# Patient Record
Sex: Female | Born: 1985
Health system: Southern US, Community
[De-identification: ages and names within clinical notes are randomized; demographics above are authoritative.]

## PROBLEM LIST (undated history)

## (undated) DIAGNOSIS — F32A Depression, unspecified: Secondary | ICD-10-CM

## (undated) DIAGNOSIS — F50019 Anorexia nervosa, restricting type, unspecified: Secondary | ICD-10-CM

## (undated) DIAGNOSIS — F419 Anxiety disorder, unspecified: Secondary | ICD-10-CM

## (undated) DIAGNOSIS — E7800 Pure hypercholesterolemia, unspecified: Secondary | ICD-10-CM

## (undated) DIAGNOSIS — F502 Bulimia nervosa, unspecified: Secondary | ICD-10-CM

## (undated) DIAGNOSIS — F5001 Anorexia nervosa, restricting type: Secondary | ICD-10-CM

## (undated) DIAGNOSIS — F329 Major depressive disorder, single episode, unspecified: Secondary | ICD-10-CM

## (undated) HISTORY — DX: Anorexia nervosa, restricting type, unspecified: F50.019

## (undated) HISTORY — DX: Major depressive disorder, single episode, unspecified: F32.9

## (undated) HISTORY — DX: Depression, unspecified: F32.A

## (undated) HISTORY — DX: Bulimia nervosa, unspecified: F50.20

## (undated) HISTORY — PX: NO PAST SURGERIES: SHX2092

## (undated) HISTORY — DX: Pure hypercholesterolemia, unspecified: E78.00

## (undated) HISTORY — DX: Anorexia nervosa, restricting type: F50.01

## (undated) HISTORY — DX: Anxiety disorder, unspecified: F41.9

## (undated) HISTORY — DX: Bulimia nervosa: F50.2

---
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
## 2015-10-14 ENCOUNTER — Encounter: Payer: Self-pay | Admitting: Obstetrics and Gynecology

## 2015-11-14 ENCOUNTER — Encounter: Payer: Self-pay | Admitting: Family Medicine

## 2015-11-14 ENCOUNTER — Ambulatory Visit (INDEPENDENT_AMBULATORY_CARE_PROVIDER_SITE_OTHER): Payer: 59 | Admitting: Family Medicine

## 2015-11-14 VITALS — BP 115/71 | HR 65 | Temp 98.3°F | Resp 18 | Ht 64.0 in | Wt 136.5 lb

## 2015-11-14 DIAGNOSIS — Z01419 Encounter for gynecological examination (general) (routine) without abnormal findings: Secondary | ICD-10-CM

## 2015-11-14 DIAGNOSIS — Z113 Encounter for screening for infections with a predominantly sexual mode of transmission: Secondary | ICD-10-CM

## 2015-11-14 MED ORDER — NORETHIN-ETH ESTRAD-FE BIPHAS 1 MG-10 MCG / 10 MCG PO TABS: 1.0000 | ORAL_TABLET | Freq: Every day | ORAL | Status: DC

## 2015-11-14 NOTE — Progress Notes (Signed)
  Subjective:     Brooke PoagLauren Smail is a 30 y.o. female here for a routine exam.  Current complaints: none.  Not currently sexually active, but would like to start OCPs.  Has been on yas in the past, but had some side effects.   Gynecologic History Patient's last menstrual period was 10/23/2015. Contraception: none Last Pap: 2014. Results were: normal Last mammogram: n/a.   Obstetric History OB History  No data available     The following portions of the patient's history were reviewed and updated as appropriate: allergies, current medications, past family history, past medical history, past social history, past surgical history and problem list.  Review of Systems Pertinent items noted in HPI and remainder of comprehensive ROS otherwise negative.    Objective:      General Appearance:    Alert, cooperative, no distress, appears stated age  Head:    Normocephalic, without obvious abnormality, atraumatic  Eyes:    PERRL, conjunctiva/corneas clear, EOM's intact, fundi    benign, both eyes  Throat:   Lips, mucosa, and tongue normal; teeth and gums normal  Neck:   Supple, symmetrical, trachea midline, no adenopathy;    thyroid:  no enlargement/tenderness/nodules; no carotid   bruit or JVD  Back:     Symmetric, no curvature, ROM normal, no CVA tenderness  Lungs:     Clear to auscultation bilaterally, respirations unlabored  Chest Wall:    No tenderness or deformity   Heart:    Regular rate and rhythm, S1 and S2 normal, no murmur, rub   or gallop  Breast Exam:    No tenderness, masses, or nipple abnormality  Abdomen:     Soft, non-tender, bowel sounds active all four quadrants,    no masses, no organomegaly  Genitalia:    Normal female without lesion, discharge or tenderness.         Vaginal rugae normal.  Cervix normal in appearance.  Uterus   normal in size.  Adnexa normal, no masses or fullness   palpated.  Extremities:   Extremities normal, atraumatic, no cyanosis or edema  Pulses:    2+ and symmetric all extremities  Skin:   Skin color, texture, turgor normal, no rashes or lesions  Lymph nodes:   Cervical, supraclavicular, and axillary nodes normal  Neurologic:   CNII-XII intact, normal strength, sensation and reflexes    throughout       Assessment:    Healthy female exam.    Plan:    Education reviewed: low fat, low cholesterol diet, self breast exams and weight bearing exercise. Contraception: OCP (estrogen/progesterone). Follow up in: 1 year.  Start Lo loestrin Check cholesterol (patient has personal history of elevated cholesterol Check CMP

## 2015-11-14 NOTE — Addendum Note (Signed)
Addended by: Gerome ApleyZEYFANG, Queena Monrreal L on: 11/14/2015 04:55 PM   Modules accepted: Orders

## 2015-11-15 LAB — COMPREHENSIVE METABOLIC PANEL
ALT: 13 U/L (ref 6–29)
AST: 19 U/L (ref 10–30)
Albumin: 4.6 g/dL (ref 3.6–5.1)
Alkaline Phosphatase: 76 U/L (ref 33–115)
BUN: 8 mg/dL (ref 7–25)
CO2: 25 mmol/L (ref 20–31)
Calcium: 9.5 mg/dL (ref 8.6–10.2)
Chloride: 104 mmol/L (ref 98–110)
Creat: 0.67 mg/dL (ref 0.50–1.10)
Glucose, Bld: 82 mg/dL (ref 65–99)
Potassium: 4.3 mmol/L (ref 3.5–5.3)
Sodium: 140 mmol/L (ref 135–146)
Total Bilirubin: 0.4 mg/dL (ref 0.2–1.2)
Total Protein: 7.3 g/dL (ref 6.1–8.1)

## 2015-11-15 LAB — LIPID PANEL
Cholesterol: 158 mg/dL (ref 125–200)
HDL: 74 mg/dL (ref >=46)
LDL Cholesterol: 65 mg/dL (ref <130)
Total CHOL/HDL Ratio: 2.1 Ratio (ref <=5.0)
Triglycerides: 96 mg/dL (ref <150)
VLDL: 19 mg/dL (ref <30)

## 2015-11-16 ENCOUNTER — Encounter: Payer: Self-pay | Admitting: Family Medicine

## 2015-11-16 LAB — CYTOLOGY - PAP

## 2015-12-21 ENCOUNTER — Ambulatory Visit (HOSPITAL_COMMUNITY): Payer: 59 | Admitting: Psychiatry

## 2015-12-27 ENCOUNTER — Ambulatory Visit (INDEPENDENT_AMBULATORY_CARE_PROVIDER_SITE_OTHER): Payer: 59 | Admitting: Licensed Clinical Social Worker

## 2015-12-27 ENCOUNTER — Encounter (HOSPITAL_COMMUNITY): Payer: Self-pay | Admitting: Licensed Clinical Social Worker

## 2015-12-27 DIAGNOSIS — F331 Major depressive disorder, recurrent, moderate: Secondary | ICD-10-CM | POA: Diagnosis not present

## 2015-12-27 DIAGNOSIS — F411 Generalized anxiety disorder: Secondary | ICD-10-CM

## 2015-12-27 NOTE — Progress Notes (Signed)
Comprehensive Clinical Assessment (CCA) Note  12/27/2015 Brooke Fernandez [id number]  Visit Diagnosis:      ICD-9-CM ICD-10-CM   1. Moderate episode of recurrent major depressive disorder (HCC) 296.32 F33.1   2. Generalized anxiety disorder 300.02 F41.1       CCA Part One  Part One has been completed on paper by the patient.  (See scanned document in Chart Review)  CCA Part Two A  Intake/Chief Complaint:  CCA Intake With Chief Complaint CCA Part Two Date: 12/27/15 CCA Part Two Time: 1616 Chief Complaint/Presenting Problem: anxiety and depression and eating disorder Patients Currently Reported Symptoms/Problems: stress, anxiety, depression mostly surrounding work Collateral Involvement: pt Individual's Strengths: educated, supportive parents, desire to be healthy Individual's Preferences: prefers outpatient therapy Individual's Abilities: ability to work through issues of anxiety and depression Type of Services Patient Feels Are Needed: outpatient therapy  Mental Health Symptoms Depression:  Depression: Change in energy/activity, Hopelessness, Increase/decrease in appetite, Irritability, Sleep (too much or little), Tearfulness, Worthlessness (isolation)  Mania:     Anxiety:   Anxiety: Restlessness, Tension, Worrying  Psychosis:     Trauma:     Obsessions:     Compulsions:     Inattention:     Hyperactivity/Impulsivity:     Oppositional/Defiant Behaviors:     Borderline Personality:  Emotional Irregularity: Chronic feelings of emptiness (negative self-image)  Other Mood/Personality Symptoms:      Mental Status Exam Appearance and self-care  Stature:  Stature: Small  Weight:  Weight: Average weight  Clothing:  Clothing: Casual  Grooming:  Grooming: Normal  Cosmetic use:  Cosmetic Use: Age appropriate  Posture/gait:  Posture/Gait: Normal  Motor activity:  Motor Activity: Not Remarkable  Sensorium  Attention:  Attention: Normal  Concentration:  Concentration: Normal   Orientation:  Orientation: X5  Recall/memory:  Recall/Memory: Normal  Affect and Mood  Affect:  Affect: Appropriate  Mood:  Mood: Euthymic  Relating  Eye contact:  Eye Contact: Normal  Facial expression:  Facial Expression: Responsive  Attitude toward examiner:  Attitude Toward Examiner: Cooperative  Thought and Language  Speech flow: Speech Flow: Normal  Thought content:  Thought Content: Appropriate to mood and circumstances  Preoccupation:     Hallucinations:     Organization:     Company secretary of Knowledge:  Fund of Knowledge: Average  Intelligence:  Intelligence: Above Average  Abstraction:  Abstraction: Normal  Judgement:  Judgement: Normal  Reality Testing:  Reality Testing: Realistic  Insight:  Insight: Good  Decision Making:  Decision Making: Normal  Social Functioning  Social Maturity:  Social Maturity: Isolates  Social Judgement:  Social Judgement: Normal  Stress  Stressors:  Stressors: Work, Transitions  Coping Ability:  Coping Ability: Deficient supports  Skill Deficits:     Supports:      Family and Psychosocial History: Family history Marital status: Single  Childhood History:  Childhood History By whom was/is the patient raised?: Mother, Father, Psychologist, occupational and step-parent Additional childhood history information:  parents divorced at age 89, doesn't remember, always had 2 families Description of patient's relationship with caregiver when they were a child: closest to biological mother, estranged from biological father Patient's description of current relationship with people who raised him/her: better relationship with father, overall healthy relationships with all 4 parents How were you disciplined when you got in trouble as a child/adolescent?: catholic guilt, I was a good kid Does patient have siblings?: Yes Number of Siblings: 2 Description of patient's current relationship with siblings: stepbrother not a  close  relationship, half  brother very close relationship Did patient suffer any verbal/emotional/physical/sexual abuse as a child?: No Did patient suffer from severe childhood neglect?: Yes (both parents are narcisistic) Has patient ever been sexually abused/assaulted/raped as an adolescent or adult?: No Was the patient ever a victim of a crime or a disaster?: No Witnessed domestic violence?: No Has patient been effected by domestic violence as an adult?: No  CCA Part Two B  Employment/Work Situation: Employment / Work Psychologist, occupationalituation Employment situation: Employed Where is patient currently employed?: [employer], 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 employed?: 6mos Has patient ever been in the Eli Lilly and Companymilitary?: No Has patient ever served in combat?: No Did You Receive Any Psychiatric Treatment/Services While in Equities traderthe Military?: No Are There Guns or Other Weapons in Your Home?: No Are These ComptrollerWeapons Safely Secured?: No  Education: Education Last Grade Completed: 16 Did Garment/textile technologistYou Graduate From [employer] School?: Yes Did Theme park managerYou Attend College?: Yes What Was Your Major?: nursing Did You Have An Individualized Education Program (IIEP): No Did You Have Any Difficulty At Progress EnergySchool?: No  Religion: Religion/Spirituality Are You A Religious Person?: No  Leisure/Recreation: Leisure / Recreation Leisure and Hobbies: take dogs for walks   Exercise/Diet: Exercise/Diet Do You Exercise?: Yes What Type of Exercise Do You Do?: Run/Walk (yoga) How Many Times a Week Do You Exercise?: 1-3 times a week Do You Follow a Special Diet?: Yes Type of Diet: flexible vegan Do You Have Any Trouble Sleeping?: No  CCA Part Two C  Alcohol/Drug Use: Alcohol / Drug Use Pain Medications: none Prescriptions: none Over the Counter: none History of alcohol / drug use?: Yes Substance #1 Name of Substance 1: alcohol - binge drinks on the weekends (Friday and Saturday nights) 1 - Age of First Use: 19 1 - Amount (size/oz): 7 drinks ( beer or liquoir) 1 -  Frequency: 2 days per week 1 - Last Use / Amount: 12/24/15 saturday - 3 large margaritas                    CCA Part Three  ASAM's:  Six Dimensions of Multidimensional Assessment  Dimension 1:  Acute Intoxication and/or Withdrawal Potential:     Dimension 2:  Biomedical Conditions and Complications:     Dimension 3:  Emotional, Behavioral, or Cognitive Conditions and Complications:     Dimension 4:  Readiness to Change:     Dimension 5:  Relapse, Continued use, or Continued Problem Potential:     Dimension 6:  Recovery/Living Environment:      Substance use Disorder (SUD)    Social Function:  Social Functioning Social Maturity: Isolates Social Judgement: Normal  Stress:  Stress Stressors: Work, Transitions Coping Ability: Deficient supports Patient Takes Medications The Patent attorneyWay The Doctor Instructed?: NA Priority Risk: Low Acuity  Risk Assessment- Self-Harm Potential: Risk Assessment For Self-Harm Potential Thoughts of Self-Harm: No current thoughts Method: No plan Availability of Means: No access/NA  Risk Assessment -Dangerous to Others Potential: Risk Assessment For Dangerous to Others Potential Method: No Plan Availability of Means: No access or NA Intent: Vague intent or NA Notification Required: No need or identified person  DSM5 Diagnoses: There are no active problems to display for this patient.   Patient Centered Plan: Patient is on the following Treatment Plan(s):  Anxiety and Depression  Recommendations for Services/Supports/Treatments: Recommendations for Services/Supports/Treatments Recommendations For Services/Supports/Treatments: Individual Therapy, Medication Management  Treatment Plan Summary:  Diminish symptoms of depression and anxiety  Referrals to Alternative Service(s): Referred to  Alternative Service(s):   Place:   Date:   Time:    Referred to Alternative Service(s):   Place:   Date:   Time:    Referred to Alternative Service(s):    Place:   Date:   Time:    Referred to Alternative Service(s):   Place:   Date:   Time:     Vernona Rieger

## 2016-01-02 ENCOUNTER — Ambulatory Visit (INDEPENDENT_AMBULATORY_CARE_PROVIDER_SITE_OTHER): Payer: 59 | Admitting: Licensed Clinical Social Worker

## 2016-01-02 DIAGNOSIS — F411 Generalized anxiety disorder: Secondary | ICD-10-CM | POA: Diagnosis not present

## 2016-01-02 DIAGNOSIS — F331 Major depressive disorder, recurrent, moderate: Secondary | ICD-10-CM

## 2016-01-04 ENCOUNTER — Encounter (HOSPITAL_COMMUNITY): Payer: Self-pay | Admitting: Licensed Clinical Social Worker

## 2016-01-04 ENCOUNTER — Ambulatory Visit (INDEPENDENT_AMBULATORY_CARE_PROVIDER_SITE_OTHER): Payer: 59 | Admitting: Licensed Clinical Social Worker

## 2016-01-04 DIAGNOSIS — F331 Major depressive disorder, recurrent, moderate: Secondary | ICD-10-CM | POA: Diagnosis not present

## 2016-01-04 DIAGNOSIS — F411 Generalized anxiety disorder: Secondary | ICD-10-CM | POA: Diagnosis not present

## 2016-01-04 NOTE — Progress Notes (Signed)
Daily Group Progress Note  Program: Oupatient    Group Time: 5:15-6:30  Participation Level: Active  Behavioral Response: Appropriate  Type of Therapy:  Psychoeducation/Group Therapy  Summary of Progress:  Today was the introductory session for the outpatient program. Pt participated in a discussion about what she needs from the group process in order to feel safe enough to participate. Pt was able to share what brought her to treatment and what she hopes to gain in an outpatient group.  Lisbeth S. Mackenzie, LCAS-A 

## 2016-01-05 ENCOUNTER — Encounter (HOSPITAL_COMMUNITY): Payer: Self-pay | Admitting: Licensed Clinical Social Worker

## 2016-01-05 NOTE — Progress Notes (Signed)
Daily Group Progress Note  Program: Outpatient     Group Time: 5:15-6:30  Participation Level: Active  Behavioral Response: Appropriate  Type of Therapy:  Psychoeducation/Group Therapy  Summary of Progress:   Pt participated in a discussion about self-care. Pt struggles with putting herself first. She has always taken care of everyone else, but not herself. Pt was encouraged to make a list of her personal self-care and steps she  must take to put herself first.    Lisbeth S. Mackenzie, LCAS-A   

## 2016-01-09 ENCOUNTER — Ambulatory Visit (INDEPENDENT_AMBULATORY_CARE_PROVIDER_SITE_OTHER): Payer: 59 | Admitting: Licensed Clinical Social Worker

## 2016-01-09 DIAGNOSIS — F331 Major depressive disorder, recurrent, moderate: Secondary | ICD-10-CM | POA: Diagnosis not present

## 2016-01-09 DIAGNOSIS — F411 Generalized anxiety disorder: Secondary | ICD-10-CM

## 2016-01-10 ENCOUNTER — Encounter (HOSPITAL_COMMUNITY): Payer: Self-pay | Admitting: Licensed Clinical Social Worker

## 2016-01-10 ENCOUNTER — Ambulatory Visit (HOSPITAL_COMMUNITY): Payer: Self-pay | Admitting: Licensed Clinical Social Worker

## 2016-01-10 NOTE — Progress Notes (Signed)
Daily Group Progress Note  Program: Outpatient   Group Time: 5:15-6:30pm  Participation Level: Active  Behavioral Response: Appropriate  Type of Therapy:  Psychoeducation/Group process  Summary of Progress: Pt participated in a discussion on the use of boundaries with family members and relationships. Pt was encouraged to remain in the present, and use non-permeable boundaries for protection against words.   Vernona RiegerLisbeth S Mackenzie, LCAS-A

## 2016-01-11 ENCOUNTER — Ambulatory Visit (INDEPENDENT_AMBULATORY_CARE_PROVIDER_SITE_OTHER): Payer: 59 | Admitting: Licensed Clinical Social Worker

## 2016-01-11 DIAGNOSIS — F411 Generalized anxiety disorder: Secondary | ICD-10-CM

## 2016-01-12 NOTE — Progress Notes (Signed)
Daily Group Progress Note  Program: Outpatient   Group Time: 5:15-6:30pm  Participation Level: Active  Behavioral Response: Appropriate  Type of Therapy:  Psychoeducation/Group process  Summary of Progress: Patient presented on time and was engaged in discussion. Patient shared struggles with her relationships and her anxieties which lead to catastrophic thinking. Patient interacted appropriately in the group and accepted feedback. Patient was encouraged to use the skill, checking the facts, to de-escalate unfounded worries. Patient denies SI/HI or any current immediate needs.    Brooke GuilesJenny Juma Oxley, MSW, LCSW, [address] BoulevardCASA

## 2016-01-16 ENCOUNTER — Ambulatory Visit (INDEPENDENT_AMBULATORY_CARE_PROVIDER_SITE_OTHER): Payer: 59 | Admitting: Psychiatry

## 2016-01-16 ENCOUNTER — Ambulatory Visit (INDEPENDENT_AMBULATORY_CARE_PROVIDER_SITE_OTHER): Payer: 59 | Admitting: Licensed Clinical Social Worker

## 2016-01-16 ENCOUNTER — Encounter: Payer: Self-pay | Admitting: Psychiatry

## 2016-01-16 VITALS — BP 112/73 | HR 63 | Temp 98.2°F | Ht 64.0 in | Wt 132.2 lb

## 2016-01-16 DIAGNOSIS — F330 Major depressive disorder, recurrent, mild: Secondary | ICD-10-CM

## 2016-01-16 DIAGNOSIS — F411 Generalized anxiety disorder: Secondary | ICD-10-CM | POA: Diagnosis not present

## 2016-01-16 DIAGNOSIS — F509 Eating disorder, unspecified: Secondary | ICD-10-CM

## 2016-01-16 DIAGNOSIS — F331 Major depressive disorder, recurrent, moderate: Secondary | ICD-10-CM

## 2016-01-16 DIAGNOSIS — Z7289 Other problems related to lifestyle: Secondary | ICD-10-CM

## 2016-01-16 DIAGNOSIS — Z789 Other specified health status: Secondary | ICD-10-CM | POA: Diagnosis not present

## 2016-01-16 MED ORDER — BUSPIRONE HCL 10 MG PO TABS: 10.0000 mg | ORAL_TABLET | Freq: Two times a day (BID) | ORAL | 0 refills | Status: DC

## 2016-01-16 NOTE — Progress Notes (Signed)
Psychiatric Initial Adult Assessment   Patient Identification: Brooke Fernandez MRN:  [id number] Date of Evaluation:  01/16/2016 Referral Source: [facility] Chief Complaint:   Chief Complaint    Establish Care; Anxiety; Depression     Visit Diagnosis:    ICD-9-CM ICD-10-CM   1. MDD (major depressive disorder), recurrent episode, mild (HCC) 296.31 F33.0   2. Eating disorder 307.50 F50.9   3. Alcohol use (HCC) V49.89 Z78.9     History of Present Illness:    Patient is a 30 year old female who presented from [place] office. She is currently attending group therapy over there. Patient reported that she has history of anxiety and depression and alcohol use. Patient has recently relocated from [facility] 6 months ago and has started working in the hospital. She reported that she has history of binge drinking and consumes alcohol over the weekend. She reported that she will not drink excessively over the weekend to the point that she will pass out. 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 She reported that she has tried all the SSRIs while she was in [place]. She does not want to take any medication that'll cause weight gain. She reported that she feels anxious throughout the day and is looking for something else. She reported that she does not want to gain weight as she has history of eating disorder and will run 15 miles on a weekly basis. She was usually do in 2-3 days. She reported that she is not binge eating at this time.  She denied using other illicit drugs including cocaine and marijuana at this time. She reported that she lives by herself as her family is still in [place]. She is trying to find new friends.  Associated Signs/Symptoms: Depression Symptoms:  depressed mood, fatigue, feelings of worthlessness/guilt, difficulty  concentrating, anxiety, (Hypo) Manic Symptoms:  Labiality of Mood, Anxiety Symptoms:  Excessive Worry, Psychotic Symptoms:  none reported PTSD Symptoms: Negative NA  Past Psychiatric History:   Patient reported history of depression and anxiety and eating disorder. She reported that she has seen a psychiatrist in the past. She denied any history of previous psychiatric  hospitalization. She denied any history of suicide attempts. She is currently attending group therapy in [place].  Previous Psychotropic Medications:   Saw a Therapist, sports in [place], [facility] All SSRI Wellbutrin- helped- last Nov Klonopin  Substance Abuse History in the last 12 months:  Yes.    Drinks alcohol on a on the weekends. Patient reported that she drinks excessively at that time. She has history of blackouts in the weekend. Patient currently denied any pending legal charges.  Consequences of Substance Abuse: Negative NA  Past Medical History:  Past Medical History:  Diagnosis Date  . Anxiety   . Depression    History reviewed. No pertinent surgical history.  Family Psychiatric History: Brother- anxiety, takes xanax Brother- alcohol - addictive personality Mother- depression- takes Prozac    Family History:  Family History  Problem Relation Age of Onset  . CAD Maternal Grandmother   . Colon cancer Maternal Grandfather   . Breast cancer Maternal Aunt   . Depression Mother   . Anxiety disorder Brother   . Depression Brother     Social History:   Social History   Social History  . Marital status: Single    Spouse name: N/A  .  Number of children: N/A  . Years of education: N/A   Social History Main Topics  . Smoking status: Never Smoker  . Smokeless tobacco: Never Used  . Alcohol use 3.0 - 6.0 oz/week    5 Shots of liquor per week  . Drug use: No  . Sexual activity: Yes    Birth control/ protection: Pill   Other Topics Concern  . None   Social  History Narrative  . None    Additional Social History:  Born in [place]. Was in [place] past 7 years.  Came to [place], [place] 6  months ago.    Allergies:  No Known Allergies  Metabolic Disorder Labs: No results found for: HGBA1C, MPG No results found for: PROLACTIN Lab Results  Component Value Date   CHOL 158 11/14/2015   TRIG 96 11/14/2015   HDL 74 11/14/2015   CHOLHDL 2.1 11/14/2015   VLDL 19 11/14/2015   LDLCALC 65 11/14/2015     Current Medications: Current Outpatient Prescriptions  Medication Sig Dispense Refill  . b complex vitamins tablet Take 1 tablet by mouth daily.    . Biotin (BIOTIN MAXIMUM STRENGTH) 10 MG TABS Take by mouth.    . Norethindrone-Ethinyl Estradiol-Fe Biphas (LO LOESTRIN FE) 1 MG-10 MCG / 10 MCG tablet Take 1 tablet by mouth daily. 1 Package 11  . Specialty Vitamins Products (ADVANCED COLLAGEN) TABS Take 600 mg by mouth.    . busPIRone (BUSPAR) 10 MG tablet Take 1 tablet (10 mg total) by mouth 2 (two) times daily. 60 tablet 0   No current facility-administered medications for this visit.     Neurologic: Headache: No Seizure: No Paresthesias:No  Musculoskeletal: Strength & Muscle Tone: within normal limits Gait & Station: normal Patient leans: N/A  Psychiatric Specialty Exam: ROS  Blood pressure 112/73, pulse 63, temperature 98.2 F (36.8 C), temperature source Oral, height 5\' 4"  (1.626 m), weight 132 lb 3.2 oz (60 kg), last menstrual period 11/17/2015.Body mass index is 22.69 kg/m.  General Appearance: Casual  Eye Contact:  Fair  Speech:  Clear and Coherent  Volume:  Normal  Mood:  Anxious  Affect:  Congruent  Thought Process:  Goal Directed  Orientation:  Full (Time, Place, and Person)  Thought Content:  WDL  Suicidal Thoughts:  No  Homicidal Thoughts:  No  Memory:  Immediate;   Fair Recent;   Fair Remote;   Fair  Judgement:  Fair  Insight:  Fair  Psychomotor Activity:  Normal  Concentration:  Concentration: Fair and  Attention Span: Fair  Recall:  [employer] of Knowledge:Fair  Language: Fair  Akathisia:  No  Handed:  Right  AIMS (if indicated):    Assets:  Communication Skills Desire for Improvement Physical Health Social Support  ADL's:  Intact  Cognition: WNL  Sleep:  8-10     Treatment Plan Summary: Medication management   Discussed with patient at length about the medications treatment risk benefits and alternatives.  Will be started on BuSpar 10 mg by mouth twice a day for her anxiety symptoms. She reported that she does not want to take any medications to cause weight gain.  I also discussed with her about the option of starting Trintillex  but she declined at this time.  She was started with a trial of BuSpar 10mg  BID to help with her anxiety at this time. She will follow-up with a psychiatrist in [facility].   She will also continue with her therapy at this time.  More than 50% of the time spent in psychoeducation, counseling and coordination of care.    This note was generated in part or whole with voice recognition software. Voice regonition is usually quite accurate but there are transcription errors that can and very often do occur. I apologize for any typographical errors that were not detected and corrected.    Brandy HaleUzma Jamara Vary, MD 8/28/20173:35 PM

## 2016-01-17 ENCOUNTER — Encounter (HOSPITAL_COMMUNITY): Payer: Self-pay | Admitting: Licensed Clinical Social Worker

## 2016-01-17 NOTE — Progress Notes (Signed)
Daily Group Progress Note  Program: Outpatient  Group Time: 5:15-6:30  Participation Level: Active  Behavioral Response: Appropriate  Type of Therapy:  Psychoeducation/Group Therapy  Summary of Progress:   Pt participated in a discussion: "being alone and not being lonely." During the discussion pt pointed out that she needs to work on her self-esteem so that she enjoys her own company. It was suggested to pt that she must first like herself before she will be able to bring someone else into her life. Pt was open during the discussion and suggestions.     Lisbeth S. Mackenzie, LCAS-A 

## 2016-01-18 ENCOUNTER — Ambulatory Visit (HOSPITAL_COMMUNITY): Payer: 59 | Admitting: Licensed Clinical Social Worker

## 2016-01-18 DIAGNOSIS — F411 Generalized anxiety disorder: Secondary | ICD-10-CM

## 2016-01-18 DIAGNOSIS — F331 Major depressive disorder, recurrent, moderate: Secondary | ICD-10-CM

## 2016-01-19 ENCOUNTER — Telehealth (HOSPITAL_COMMUNITY): Payer: Self-pay | Admitting: Licensed Clinical Social Worker

## 2016-01-19 ENCOUNTER — Encounter (HOSPITAL_COMMUNITY): Payer: Self-pay | Admitting: Licensed Clinical Social Worker

## 2016-01-19 NOTE — Progress Notes (Signed)
  Daily Group Progress Note  Program: Outpatient  Group Time: 5:15-6:30  Participation Level: Active  Behavioral Response: Appropriate  Type of Therapy:  Psychoeducation/Group Therapy  Summary of Progress:   Pt participated in a discussion: "Being strong, when you don't feel like being strong." Pt appeared open in the discussion about expectations from others. It was suggested to pt to use her self-awareness and self-care to be herself. Pt was receptive to the intervention.   Zitlali Primm S. Oriel Ojo, LCAS-A   

## 2016-01-23 ENCOUNTER — Ambulatory Visit (HOSPITAL_COMMUNITY): Payer: Self-pay | Admitting: Licensed Clinical Social Worker

## 2016-01-25 ENCOUNTER — Ambulatory Visit (INDEPENDENT_AMBULATORY_CARE_PROVIDER_SITE_OTHER): Payer: 59 | Admitting: Licensed Clinical Social Worker

## 2016-01-25 DIAGNOSIS — F331 Major depressive disorder, recurrent, moderate: Secondary | ICD-10-CM

## 2016-01-26 NOTE — Progress Notes (Signed)
Daily Group Progress Note  Program: Outpatient   Group Time: 5:15-6:45pm  Participation Level: Active  Behavioral Response: Appropriate  Type of Therapy:  Psychoeducation/Group process  Summary of Progress: Patient presented on time and was engaged in discussion. Patient shared continued struggles with her job and difficulty managing negative emotions related to her job. Patient interacted appropriately in the group and accepted feedback. Patient was encouraged to remember HALT and utilize self care to manage what she can.  Patient denies SI/HI or any current immediate needs.    Donia GuilesJenny Peyton Spengler, MSW, LCSW, [address]

## 2016-01-30 ENCOUNTER — Ambulatory Visit (INDEPENDENT_AMBULATORY_CARE_PROVIDER_SITE_OTHER): Payer: 59 | Admitting: Licensed Clinical Social Worker

## 2016-01-30 DIAGNOSIS — F411 Generalized anxiety disorder: Secondary | ICD-10-CM | POA: Diagnosis not present

## 2016-02-01 ENCOUNTER — Ambulatory Visit (INDEPENDENT_AMBULATORY_CARE_PROVIDER_SITE_OTHER): Payer: 59 | Admitting: Licensed Clinical Social Worker

## 2016-02-01 ENCOUNTER — Telehealth: Payer: Self-pay

## 2016-02-01 ENCOUNTER — Encounter (HOSPITAL_COMMUNITY): Payer: Self-pay | Admitting: Licensed Clinical Social Worker

## 2016-02-01 DIAGNOSIS — F411 Generalized anxiety disorder: Secondary | ICD-10-CM

## 2016-02-01 NOTE — Progress Notes (Signed)
Daily Group Progress Note  Program: Outpatient   Group Time: 5:15-6:6:45pm  Participation Level: Active  Behavioral Response: Appropriate  Type of Therapy:  Psychoeducation/Group Therapy  Summary of Progress:   Pt participated in a discussion on vulnerability and trust in relationships. Pt discussed Brene Brown's Ted Talks on vulnerability. Pt was encouraged to use the information from Brene Brown's Ted Talks "Vulnerability" to begin forming trust in her ownrelationships. Pt was receptive to intervention and suggestions.     Lisbeth S. Mackenzie, LCAS-A    

## 2016-02-01 NOTE — Telephone Encounter (Signed)
See the attached message from dr. Garnetta Buddyfaheem

## 2016-02-01 NOTE — Telephone Encounter (Signed)
[facility] states that the patient wants to do geno testing.   Pt was seen on  01-16-16.

## 2016-02-01 NOTE — Telephone Encounter (Signed)
F/u with [place] Psychiatrist

## 2016-02-02 ENCOUNTER — Encounter (HOSPITAL_COMMUNITY): Payer: Self-pay | Admitting: Licensed Clinical Social Worker

## 2016-02-02 NOTE — Telephone Encounter (Signed)
Understood 

## 2016-02-02 NOTE — Progress Notes (Signed)
Daily Group Progress Note  Program: Outpatient   Group Time: 5:15-7:00pm  Participation Level: Active  Behavioral Response: Appropriate  Type of Therapy:  Psychoeducation/Group Therapy  Summary of Progress:   Pt participated in a discussion on anxiety triggers. Pt identified her personal anxiety triggers. Pt was encouraged to use her coping skills to prevent the anxiety from spiraling out of control.    Vernona RiegerLisbeth S. Jefrey Raburn, LCAS-A

## 2016-02-06 ENCOUNTER — Ambulatory Visit (INDEPENDENT_AMBULATORY_CARE_PROVIDER_SITE_OTHER): Payer: 59 | Admitting: Licensed Clinical Social Worker

## 2016-02-06 DIAGNOSIS — F411 Generalized anxiety disorder: Secondary | ICD-10-CM

## 2016-02-06 DIAGNOSIS — F331 Major depressive disorder, recurrent, moderate: Secondary | ICD-10-CM | POA: Diagnosis not present

## 2016-02-07 ENCOUNTER — Encounter (HOSPITAL_COMMUNITY): Payer: Self-pay | Admitting: Licensed Clinical Social Worker

## 2016-02-07 NOTE — Progress Notes (Signed)
Daily Group Progress Note  Program: Outpatient Group   Group Time: 5:15-6:55pm  Participation Level: Active  Behavioral Response: Appropriate  Type of Therapy:  Psychoeducation/Group Therapy  Summary of Progress:   Pt participated in a discussion on relationships in recovery; mental illness can destroy relationships or enhance them. Pt was encouraged to put herself and her recovery first before beginning a new relationship. Pt was engaged and receptive to the group interventions.   Jomarie Gellis S. Daiya Tamer, LCAS-A 

## 2016-02-08 ENCOUNTER — Ambulatory Visit (INDEPENDENT_AMBULATORY_CARE_PROVIDER_SITE_OTHER): Payer: 59 | Admitting: Licensed Clinical Social Worker

## 2016-02-08 DIAGNOSIS — F411 Generalized anxiety disorder: Secondary | ICD-10-CM | POA: Diagnosis not present

## 2016-02-08 DIAGNOSIS — F900 Attention-deficit hyperactivity disorder, predominantly inattentive type: Secondary | ICD-10-CM | POA: Diagnosis not present

## 2016-02-08 DIAGNOSIS — F331 Major depressive disorder, recurrent, moderate: Secondary | ICD-10-CM

## 2016-02-09 ENCOUNTER — Encounter (HOSPITAL_COMMUNITY): Payer: Self-pay | Admitting: Licensed Clinical Social Worker

## 2016-02-09 NOTE — Progress Notes (Signed)
Daily Group Progress Note  Program: Outpatient Group   Group Time: 5:15-6:50pm  Participation Level: Active  Behavioral Response: Appropriate  Type of Therapy:  Psychoeducation/Group Therapy  Summary of Progress:   Pt participated in a discussion on family roles in a dysfunctional family. Pt was brought up in a dysfunctional family. Her parents divorced when she was 4 and her stepfather came into her life shortly afterwards. 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 Suggested to pt to use her coping skills for anxiety while she is in the OR. The group gave her good suggestions as well. Pt was receptive to the intervention and suggestions during group process.   [name], LCAS-A

## 2016-02-13 ENCOUNTER — Ambulatory Visit (INDEPENDENT_AMBULATORY_CARE_PROVIDER_SITE_OTHER): Payer: 59 | Admitting: Licensed Clinical Social Worker

## 2016-02-13 DIAGNOSIS — F331 Major depressive disorder, recurrent, moderate: Secondary | ICD-10-CM

## 2016-02-13 DIAGNOSIS — F411 Generalized anxiety disorder: Secondary | ICD-10-CM | POA: Diagnosis not present

## 2016-02-15 ENCOUNTER — Encounter (HOSPITAL_COMMUNITY): Payer: Self-pay | Admitting: Licensed Clinical Social Worker

## 2016-02-15 ENCOUNTER — Ambulatory Visit (INDEPENDENT_AMBULATORY_CARE_PROVIDER_SITE_OTHER): Payer: 59 | Admitting: Licensed Clinical Social Worker

## 2016-02-15 DIAGNOSIS — F411 Generalized anxiety disorder: Secondary | ICD-10-CM | POA: Diagnosis not present

## 2016-02-15 DIAGNOSIS — F331 Major depressive disorder, recurrent, moderate: Secondary | ICD-10-CM | POA: Diagnosis not present

## 2016-02-15 NOTE — Progress Notes (Signed)
Daily Group Progress Note Program:  Outpatient  Group Time: 5:15-6:15 pm  Participation Level: Active  Behavioral Response: Appropriate  Type of Therapy:  Psychoeducation/Therapy  Summary of Progress: Pt. Presents with primarily flat affect, responsive to group questions and process. Pt. Reported she had a good weekend with her new boyfriend and was tired. Pt participated in a discussion on vulnerability. This has proven difficult in her past but she is trying it with her new relationship. PT states she has never been as open as she is now. Pt participated in a discussion on "bashing ourselves.Marland Kitchen.we're pretty good at it." Pt received good feedback from other group members and was receptive to the intervention.     Vernona RiegerLisbeth S. Kelliann Pendergraph, LCAS-A

## 2016-02-20 ENCOUNTER — Ambulatory Visit (HOSPITAL_COMMUNITY): Payer: 59 | Admitting: Licensed Clinical Social Worker

## 2016-02-20 ENCOUNTER — Encounter (HOSPITAL_COMMUNITY): Payer: Self-pay | Admitting: Licensed Clinical Social Worker

## 2016-02-20 DIAGNOSIS — F331 Major depressive disorder, recurrent, moderate: Secondary | ICD-10-CM

## 2016-02-20 DIAGNOSIS — F411 Generalized anxiety disorder: Secondary | ICD-10-CM

## 2016-02-20 NOTE — Progress Notes (Signed)
  Daily Group Progress Note Program:  Outpatient  Group Time: 5:00-6:00 pm  Participation Level: Active  Behavioral Response: Appropriate  Type of Therapy:  Psychoeducation/Therapy  Summary of Progress: Pt participated in a discussion on co-dependency where she was able to identify how she in her relationships  Pt was open and honest in the discussion. Pt was given a handout on co-dependency. Pt was encouraged to use "Keep the focus on ourselves,"  in terms of self-empowerment and permission to heal from previous dysfunctional relationship.  Vernona RiegerLisbeth S. Corrine Tillis, LCAS-A

## 2016-02-21 ENCOUNTER — Encounter (HOSPITAL_COMMUNITY): Payer: Self-pay | Admitting: Licensed Clinical Social Worker

## 2016-02-21 NOTE — Progress Notes (Signed)
Daily Group Progress Note Program:  Outpatient  Group Time: 5:00-6:00 pm  Participation Level: Active  Behavioral Response: Appropriate  Type of Therapy:  Psychoeducation/Therapy  Summary of Progress: Pt participated in a group on lack of control of a peaceful existence. Pt participated in a discussion about the mass murders in [place] and the effects on her practices of self-care. Pt was encouraged to continue her desire for a peaceful existence even though there are uncontrollable issues that sometimes makes the world unpredictable. Pt was active in expressing her feelings of her personal self-care.   Joanne Brander S. Lawernce Earll, LCAS-A 

## 2016-02-22 ENCOUNTER — Ambulatory Visit (INDEPENDENT_AMBULATORY_CARE_PROVIDER_SITE_OTHER): Payer: 59 | Admitting: Licensed Clinical Social Worker

## 2016-02-22 DIAGNOSIS — F331 Major depressive disorder, recurrent, moderate: Secondary | ICD-10-CM | POA: Diagnosis not present

## 2016-02-22 DIAGNOSIS — F411 Generalized anxiety disorder: Secondary | ICD-10-CM | POA: Diagnosis not present

## 2016-02-24 NOTE — Progress Notes (Signed)
Daily Group Progress Note  Program: Outpatient   Group Time: 5:15-6:45pm  Participation Level: Active  Behavioral Response: Appropriate  Type of Therapy:  Psychoeducation/Group process  Summary of Progress: Patient presented on time and was engaged in discussion. Patient shared having an "awful" 48 hours due to conflict with her mother, problems at work, and struggles with her relationship which led to having SI. Clinician validated patient's feelings and her coping efforts when she was struggling. Patient interacted appropriately in the group and accepted feedback. Patient was encouraged to utilize check the facts and radical acceptance.  Patient denies current SI/HI and self reports decreased negative feelings upon leaving group.   Donia GuilesJenny Lavaughn Haberle, MSW, LCSW, LCAS

## 2016-02-27 ENCOUNTER — Ambulatory Visit (HOSPITAL_COMMUNITY): Payer: 59 | Admitting: Licensed Clinical Social Worker

## 2016-02-27 DIAGNOSIS — F411 Generalized anxiety disorder: Secondary | ICD-10-CM

## 2016-02-27 DIAGNOSIS — F331 Major depressive disorder, recurrent, moderate: Secondary | ICD-10-CM

## 2016-02-28 ENCOUNTER — Encounter (HOSPITAL_COMMUNITY): Payer: Self-pay | Admitting: Licensed Clinical Social Worker

## 2016-02-28 NOTE — Progress Notes (Signed)
Daily Group Progress Note Program:  Outpatient  Group Time: 5:00-6:00 pm  Participation Level: Active  Behavioral Response: Appropriate  Type of Therapy:  Psychoeducation/Therapy  Summary of Progress: Pt participated in a discussion on discovering and reconstructing an enduring sense of self as an important aspect for improvement of symptoms of depression and anxiety. Pt was encouraged to accomplish her self-determined goals that will enhance her sense of well-being. Pt was receptive to suggestions and encouragement given during the intervention.   Daesean Lazarz S. Sabien Umland, LCAS 

## 2016-02-29 ENCOUNTER — Ambulatory Visit (HOSPITAL_COMMUNITY): Payer: 59 | Admitting: Licensed Clinical Social Worker

## 2016-02-29 DIAGNOSIS — F411 Generalized anxiety disorder: Secondary | ICD-10-CM

## 2016-02-29 DIAGNOSIS — F331 Major depressive disorder, recurrent, moderate: Secondary | ICD-10-CM

## 2016-03-01 ENCOUNTER — Encounter (HOSPITAL_COMMUNITY): Payer: Self-pay | Admitting: Licensed Clinical Social Worker

## 2016-03-01 NOTE — Progress Notes (Signed)
Daily Group Progress Note Program:  Outpatient   Group Time: 5:00-6:00 pm  Participation Level: Active  Behavioral Response: Appropriate  Type of Therapy:  Psychoeducation/Therapy  Summary of Progress: Pt participated in a discussion on incorporating new feelings in a life of mental wellness. Feeling "good" is a new and different way for patients that have been immersed in mental illness, and may feel uncomfortable at first. The depressed brain sees feeling good as different and "not right", so the tendency is to go back to the thoughts, feelings and behaviors of depression. Pt was encouraged to move towards a process of change through where she can improve her health and wellness, live a self-directed life, with the ability to strive to reach her full potential. Pt participated in the intervention and suggestions/feedback.  Lisbeth S. Mackenzie, LCAS 

## 2016-03-05 ENCOUNTER — Ambulatory Visit (INDEPENDENT_AMBULATORY_CARE_PROVIDER_SITE_OTHER): Payer: 59 | Admitting: Licensed Clinical Social Worker

## 2016-03-05 DIAGNOSIS — F411 Generalized anxiety disorder: Secondary | ICD-10-CM

## 2016-03-05 DIAGNOSIS — F331 Major depressive disorder, recurrent, moderate: Secondary | ICD-10-CM | POA: Diagnosis not present

## 2016-03-06 ENCOUNTER — Encounter (HOSPITAL_COMMUNITY): Payer: Self-pay | Admitting: Licensed Clinical Social Worker

## 2016-03-06 NOTE — Progress Notes (Signed)
BH MD/PA/NP OP Progress Note  03/07/2016 2:14 PM Brooke Fernandez  MRN:  [id number]  Chief Complaint:  Chief Complaint    Follow-up; Depression; Anxiety     Subjective:  "I feel flat" HPI:  Brooke Fernandez is a 30 year old female with anxiety, depression, alcohol use who presented for follow up appointment.   Patient states that she discontinued booster after a few days, and she experienced worsening depression. She states that she continues to feel anxious especially when she is around with people and when she is by herself. She denies any panic attack. She reports worsening depression and states she thought of coming to the hospital a few weeks ago as she felt lonely and had SI in the setting of breaking up with her boyfriend. She denies any intent or plans. She reports she has been very stressed at work (she works as Scientist, forensic at [employer]). She reports that she needed to miss work twice for the past 2 months due to her mood. She has been trying to be active, working out, and going out with her friends.  She sleeps 8 hours and reports good appetite. She states she drinks 2-3 times per month  to "get drunk" to avoid social anxiety. She drinks seven beers or vodka. She states that she does not feel wild off her drinking, and she is aware that drinking will eventually worsen her anxiety. She denies any problems involving her drinking. She denies any drug use. She goes to group therapy twice a week, and finds it very helpful.  Visit Diagnosis:    ICD-9-CM ICD-10-CM   1. Moderate episode of recurrent major depressive disorder (HCC) 296.32 F33.1   2. Generalized anxiety disorder 300.02 F41.1     Past Psychiatric History:  Outpatient: Group therapy in [place], used to see a psychiatrist in [place], [place] Psychiatry admission: denies Previous suicide attempt: denies Past trials of medication: "all SSRI," Wellbutrin, clonazepam  Patient received pharmacogenemic test, which will be scanned into her  chart.  Use as directed- desipramine, duloxetine (pt reports significant side effect), fluvoxamine, nortriptyline, vortioxetine  Past Medical History:  Past Medical History:  Diagnosis Date  . Anxiety   . Depression    History reviewed. No pertinent surgical history.  Family Psychiatric History:  Brother- anxiety, takes xanax Mother- depression   Family History:  Family History  Problem Relation Age of Onset  . CAD Maternal Grandmother   . Colon cancer Maternal Grandfather   . Breast cancer Maternal Aunt   . Depression Mother   . Anxiety disorder Brother   . Depression Brother     Social History:  Social History   Social History  . Marital status: Single    Spouse name: N/A  . Number of children: N/A  . Years of education: N/A   Social History Main Topics  . Smoking status: Never Smoker  . Smokeless tobacco: Never Used  . Alcohol use 3.0 - 6.0 oz/week    5 Shots of liquor per week  . Drug use: No  . Sexual activity: Yes    Birth control/ protection: Pill   Other Topics Concern  . None   Social History Narrative  . None    Allergies: No Known Allergies  Metabolic Disorder Labs: No results found for: HGBA1C, MPG No results found for: PROLACTIN Lab Results  Component Value Date   CHOL 158 11/14/2015   TRIG 96 11/14/2015   HDL 74 11/14/2015   CHOLHDL 2.1 11/14/2015   VLDL  19 11/14/2015   LDLCALC 65 11/14/2015     Current Medications: Current Outpatient Prescriptions  Medication Sig Dispense Refill  . b complex vitamins tablet Take 1 tablet by mouth daily.    . Biotin (BIOTIN MAXIMUM STRENGTH) 10 MG TABS Take by mouth.    . Norethindrone-Ethinyl Estradiol-Fe Biphas (LO LOESTRIN FE) 1 MG-10 MCG / 10 MCG tablet Take 1 tablet by mouth daily. 1 Package 11  . Specialty Vitamins Products (ADVANCED COLLAGEN) TABS Take 600 mg by mouth.    Marland Kitchen. buPROPion (WELLBUTRIN XL) 150 MG 24 hr tablet Take 1 tablet (150 mg total) by mouth every morning. 30 tablet 0   No  current facility-administered medications for this visit.     Neurologic: Headache: No Seizure: No Paresthesias: No  Musculoskeletal: Strength & Muscle Tone: within normal limits Gait & Station: normal Patient leans: N/A  Psychiatric Specialty Exam: Review of Systems  Psychiatric/Behavioral: Positive for depression and substance abuse. Negative for hallucinations and suicidal ideas. The patient is nervous/anxious.   All other systems reviewed and are negative.   Blood pressure 109/64, pulse 62, height 5\' 4"  (1.626 m), weight 132 lb (59.9 kg).Body mass index is 22.66 kg/m.  General Appearance: Casual  Eye Contact:  Fair  Speech:  Clear and Coherent  Volume:  Normal  Mood:  Anxious and Depressed  Affect:  Restricted  Thought Process:  Coherent and Goal Directed  Orientation:  Full (Time, Place, and Person)  Thought Content: Logical  Perceptions: denies AH/VH  Suicidal Thoughts:  No  Homicidal Thoughts:  No  Memory:  Immediate;   Good Recent;   Good Remote;   Good  Judgement:  Good  Insight:  Fair  Psychomotor Activity:  Normal  Concentration:  Concentration: Good and Attention Span: Good  Recall:  Good  Fund of Knowledge: Good  Language: Good  Akathisia:  NA  Handed:  Right  AIMS (if indicated):  N/A  Assets:  Communication Skills Desire for Improvement  ADL's:  Intact  Cognition: WNL  Sleep:  good   Assessment Brooke Fernandez is a 30 year old female with anxiety, depression, alcohol use who presented for follow up appointment.   # MDD # GAD Patient reports ongoing mood symptoms in the setting of breaking up with her boyfriend. Patient has multiple adverse reaction to antidepressants; will try Bupropion. Although this medication can worsen anxiety, she prefers this medication to others, given she had good response from this medication. Will plan to slowly uptitrate it. Patient will continue group therapy. Noted that patient is at contemplative stage for her alcohol  use; discussed its impact on her mood symptoms. Will continue motivational interview. She may benefit from gabapentin for sobriety/anxiety in the future.   Plan 1. Start Wellbutrin 150 mg daily 2. Continue group therapy 3. Return to clinic in one month  The patient demonstrates the following risk factors for suicide: Chronic risk factors for suicide include: psychiatric disorder of depression, anxiety. Acute risk factors for suicide include: loss (financial, interpersonal, professional). Protective factors for this patient include: positive social support, positive therapeutic relationship, coping skills and hope for the future. Considering these factors, the overall suicide risk at this point appears to be low. Patient is appropriate for outpatient follow up.  Treatment Plan Summary:Plan as above   Brooke Hottereina Delon Revelo, MD 03/07/2016, 2:14 PM

## 2016-03-06 NOTE — Progress Notes (Signed)
Daily Group Progress Note Program:  Outpatient  Group Time: 5:00-6:00 pm  Participation Level: Active  Behavioral Response: Appropriate  Type of Therapy:  Psychoeducation/Therapy  Summary of Progress:  Pt participated in a discussion on childhood experiences affecting adult mental wellness. Survivors of negative childhood experiences often tend to develop unhealthy coping skills and difficulty regulating emotions which effect adult mental wellness, especially anxiety and depression. Pt was encouraged to continue using her positive coping skills for her anxiety and depression, practice self-care and develop a positive support system. Pt was active during the intervention and was receptive to suggestions and feedback.  Vernona RiegerLisbeth S. Mackenzie, LCAS

## 2016-03-07 ENCOUNTER — Ambulatory Visit (HOSPITAL_COMMUNITY): Payer: Self-pay | Admitting: Psychiatry

## 2016-03-07 ENCOUNTER — Encounter (HOSPITAL_COMMUNITY): Payer: Self-pay | Admitting: Licensed Clinical Social Worker

## 2016-03-07 ENCOUNTER — Encounter (HOSPITAL_COMMUNITY): Payer: Self-pay | Admitting: Psychiatry

## 2016-03-07 ENCOUNTER — Ambulatory Visit (INDEPENDENT_AMBULATORY_CARE_PROVIDER_SITE_OTHER): Payer: 59 | Admitting: Psychiatry

## 2016-03-07 ENCOUNTER — Ambulatory Visit (INDEPENDENT_AMBULATORY_CARE_PROVIDER_SITE_OTHER): Payer: 59 | Admitting: Licensed Clinical Social Worker

## 2016-03-07 VITALS — BP 109/64 | HR 62 | Ht 64.0 in | Wt 132.0 lb

## 2016-03-07 DIAGNOSIS — F411 Generalized anxiety disorder: Secondary | ICD-10-CM

## 2016-03-07 DIAGNOSIS — F331 Major depressive disorder, recurrent, moderate: Secondary | ICD-10-CM | POA: Diagnosis not present

## 2016-03-07 DIAGNOSIS — Z800 Family history of malignant neoplasm of digestive organs: Secondary | ICD-10-CM | POA: Diagnosis not present

## 2016-03-07 DIAGNOSIS — Z803 Family history of malignant neoplasm of breast: Secondary | ICD-10-CM | POA: Diagnosis not present

## 2016-03-07 DIAGNOSIS — Z818 Family history of other mental and behavioral disorders: Secondary | ICD-10-CM

## 2016-03-07 MED ORDER — BUPROPION HCL ER (XL) 150 MG PO TB24: 150.0000 mg | ORAL_TABLET | ORAL | 0 refills | Status: DC

## 2016-03-07 NOTE — Patient Instructions (Signed)
1. Start Wellbutrin 150 mg daily 2. Continue group therapy 3. Return to clinic in one month

## 2016-03-07 NOTE — Progress Notes (Signed)
Daily Group Progress Note Program:  Outpatient   Group Time: 5:00-6:00 pm  Participation Level: Active  Behavioral Response: Appropriate  Type of Therapy:  Psychoeducation/Therapy  Summary of Progress: Pt participated in a discussion on coping with anxiety and depression on a daily basis. "Every day I live a lie because everyone sees me as a beautiful confident woman but no one sees me when I'm crying or having a panic attack." "That's not beautiful." Pt was encouraged to continue to talk about her feelings in group, journal her thoughts and feelings and continue to use her effective coping skills.    Lisbeth S. Mackenzie, LCAS   

## 2016-03-12 ENCOUNTER — Ambulatory Visit (HOSPITAL_COMMUNITY): Payer: 59 | Admitting: Licensed Clinical Social Worker

## 2016-03-12 DIAGNOSIS — F330 Major depressive disorder, recurrent, mild: Secondary | ICD-10-CM

## 2016-03-12 DIAGNOSIS — F411 Generalized anxiety disorder: Secondary | ICD-10-CM

## 2016-03-13 ENCOUNTER — Encounter (HOSPITAL_COMMUNITY): Payer: Self-pay | Admitting: Licensed Clinical Social Worker

## 2016-03-13 ENCOUNTER — Ambulatory Visit (INDEPENDENT_AMBULATORY_CARE_PROVIDER_SITE_OTHER): Payer: 59 | Admitting: Licensed Clinical Social Worker

## 2016-03-13 DIAGNOSIS — F331 Major depressive disorder, recurrent, moderate: Secondary | ICD-10-CM | POA: Diagnosis not present

## 2016-03-13 DIAGNOSIS — F411 Generalized anxiety disorder: Secondary | ICD-10-CM

## 2016-03-13 NOTE — Progress Notes (Signed)
Daily Group Progress Note Program:  Outpatient  Group Time: 5:00-6:00 pm  Participation Level: Active  Behavioral Response: Appropriate  Type of Therapy:  Psychoeducation/Therapy  Summary of Progress:  Pt participated in a discussion about empowerment in mental wellness by starting and maintaining conversations of hope and desired outcomes and replacing old disempowering conversations with new ones of strengths and wellness. Pt was encouraged to practice conversations based on strengths and wellness. Pt was receptive to suggestions and intervention.  Lisbeth S. Mackenzie, LCAS 

## 2016-03-13 NOTE — Progress Notes (Signed)
   THERAPIST PROGRESS NOTE  Session Time: 4:10-5pm  Participation Level: Active  Behavioral Response: CasualAlertAnxious  Type of Therapy: Individual Therapy  Treatment Goals addressed: Coping  Interventions: DBT  Summary: Brooke PoagLauren Fernandez is a 30 y.o. female who presents with depression and anxiety. Pt is currently in the OP evening group 2x per week but also requested indiividual sessions. She had a recent break up in a new relationship and is trying to cope with her feelings. 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 She is lonely and does not like being alone. Worked with pt to identify her core beliefs. Discussed whether he core beliefs were true. Pt's core beliefs have confirmed to her that she is not a good nurse, no one wants to date her and no one wants to be around her. Processed with the pt whether the core beliefs were true.    Suicidal/Homicidal: Nowithout intent/plan  Therapist Response: Processed with the pt her feelings due to the stressors currently in her life. Assisted pt in challenging her  core beliefs. Encouraged pt to write down and follow through with her balanced beliefs. Processed with pt her stressors and coping strategies.   Plan: Return again in 2 weeks.  Diagnosis: Axis I: F33.1, F41.1        Martinez Boxx S, LCAS 03/13/2016

## 2016-03-14 ENCOUNTER — Ambulatory Visit (INDEPENDENT_AMBULATORY_CARE_PROVIDER_SITE_OTHER): Payer: 59 | Admitting: Licensed Clinical Social Worker

## 2016-03-14 DIAGNOSIS — F411 Generalized anxiety disorder: Secondary | ICD-10-CM

## 2016-03-14 DIAGNOSIS — F331 Major depressive disorder, recurrent, moderate: Secondary | ICD-10-CM

## 2016-03-15 NOTE — Progress Notes (Signed)
Daily Group Progress Note  Program: Outpatient   Group Time: 5:30-6:30pm  Participation Level: Active  Behavioral Response: Appropriate  Type of Therapy:  Psychoeducation/Group process  Summary of Progress: Patient presented on time and was engaged in discussion. Patient shared improved mood since last group session and that she has been utilizing meditation and inspiring quotes to maintain a positive outlook. Patient states increased optimism regarding work. Patient interacted appropriately in the group and gave/accepted feedback.  Patient denies current SI/HI.   Donia GuilesJenny Vi Biddinger, MSW, LCSW, LCAS

## 2016-03-19 ENCOUNTER — Ambulatory Visit (INDEPENDENT_AMBULATORY_CARE_PROVIDER_SITE_OTHER): Payer: 59 | Admitting: Licensed Clinical Social Worker

## 2016-03-19 ENCOUNTER — Ambulatory Visit (HOSPITAL_COMMUNITY): Payer: Self-pay | Admitting: Psychiatry

## 2016-03-19 DIAGNOSIS — F331 Major depressive disorder, recurrent, moderate: Secondary | ICD-10-CM | POA: Diagnosis not present

## 2016-03-19 DIAGNOSIS — F411 Generalized anxiety disorder: Secondary | ICD-10-CM | POA: Diagnosis not present

## 2016-03-20 ENCOUNTER — Encounter (HOSPITAL_COMMUNITY): Payer: Self-pay | Admitting: Licensed Clinical Social Worker

## 2016-03-20 NOTE — Progress Notes (Signed)
Daily Group Progress Note Program:  Outpatient  Group Time: 5:30-6:30 pm  Participation Level: Active  Behavioral Response: Appropriate  Type of Therapy:  Psychoeducation/Therapy  Summary of Progress:  Pt participated in a discussion on the impact mental illness has on levels of self-confidence, while pursuing mental wellness and recovery. Pt was encouraged to find stability or work towards it, which will increase levels of confidence. In learning through self-reflection, pt will discover what she works hardest to achieve will feel the most satisfying and make recovery more easily attainable. Pt was receptive to suggestions and intervention.  Vernona RiegerLisbeth S. Mackenzie, LCAS

## 2016-03-21 ENCOUNTER — Ambulatory Visit (HOSPITAL_COMMUNITY): Payer: 59 | Admitting: Licensed Clinical Social Worker

## 2016-03-21 DIAGNOSIS — F331 Major depressive disorder, recurrent, moderate: Secondary | ICD-10-CM

## 2016-03-21 DIAGNOSIS — F411 Generalized anxiety disorder: Secondary | ICD-10-CM

## 2016-03-22 ENCOUNTER — Encounter (HOSPITAL_COMMUNITY): Payer: Self-pay | Admitting: Licensed Clinical Social Worker

## 2016-03-22 ENCOUNTER — Ambulatory Visit (INDEPENDENT_AMBULATORY_CARE_PROVIDER_SITE_OTHER): Payer: 59 | Admitting: Licensed Clinical Social Worker

## 2016-03-22 DIAGNOSIS — F411 Generalized anxiety disorder: Secondary | ICD-10-CM

## 2016-03-22 DIAGNOSIS — F331 Major depressive disorder, recurrent, moderate: Secondary | ICD-10-CM

## 2016-03-22 NOTE — Progress Notes (Signed)
Daily Group Progress Note Program:  Outpatient  Group Time: 5:30-6:30 pm  Participation Level: Active  Behavioral Response: Appropriate  Type of Therapy:  Psychoeducation/Therapy  Summary of Progress:  Pt participated in a discussion on self-awareness = mental wellness, channeling thoughts, emotions and actions. Failure to be aware leads to automatic thoughts, feelings and actions that may be counterproductive to pt's goals.  Pt was encouraged to assess what is right in her life and what she needs to modify to move towards self-awareness through conscious awareness. Pt was receptive to intervention and suggestions.  Arushi Partridge S. Riannah Stagner, LCAS 

## 2016-03-22 NOTE — Progress Notes (Signed)
   THERAPIST PROGRESS NOTE  Session Time: 4:10-5pm  Participation Level: Active  Behavioral Response: CasualAlertAnxious  Type of Therapy: Individual Therapy  Treatment Goals addressed: Coping  Interventions: DBT  Summary: Brooke Fernandez is a 30 y.o. female who presents with depression and anxiety. Pt is currently in the OP evening group 2x per week but also requested indiividual sessions. Pt is making significant inroads in her recovery plan. Pt has been open to making changes in her life. 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 This is a cause for great stress and desire to be perfect. Processed with pt the desire for perfection. This is very hard for her and it adds to her anxiety. Pt has recently started engaging in meditation daily which has helped put her in a somber place before she goes into the hectic perfect world of the OR. Asked pt to name 10 attributes and challenged her to make signs with these attributes all over the house and repeat them to herself. These attributes are her own words. Pt is continuing to journal and challenged her to journal her feelings daily. Pt was receptive to the suggestions and intervention.       Suicidal/Homicidal: Nowithout intent/plan  Therapist Response: Processed with the pt her  the stressors currently in her life. And effective coping skills.    Plan: Return again in 2 weeks.  Diagnosis: Axis I: F33.1, F41.1        Brooke Fernandez, LCAS 03/22/2016

## 2016-03-26 ENCOUNTER — Ambulatory Visit (INDEPENDENT_AMBULATORY_CARE_PROVIDER_SITE_OTHER): Payer: 59 | Admitting: Licensed Clinical Social Worker

## 2016-03-26 DIAGNOSIS — F411 Generalized anxiety disorder: Secondary | ICD-10-CM | POA: Diagnosis not present

## 2016-03-26 DIAGNOSIS — F331 Major depressive disorder, recurrent, moderate: Secondary | ICD-10-CM

## 2016-03-27 ENCOUNTER — Encounter (HOSPITAL_COMMUNITY): Payer: Self-pay | Admitting: Licensed Clinical Social Worker

## 2016-03-27 ENCOUNTER — Telehealth (HOSPITAL_COMMUNITY): Payer: Self-pay | Admitting: *Deleted

## 2016-03-27 ENCOUNTER — Telehealth (HOSPITAL_COMMUNITY): Payer: Self-pay

## 2016-03-27 ENCOUNTER — Telehealth (HOSPITAL_COMMUNITY): Payer: Self-pay | Admitting: Psychiatry

## 2016-03-27 MED ORDER — BUPROPION HCL ER (XL) 300 MG PO TB24: 300.0000 mg | ORAL_TABLET | ORAL | 0 refills | Status: DC

## 2016-03-27 NOTE — Telephone Encounter (Signed)
Pt called stating she is returning Dr. Vanetta ShawlHisada phone call. Pt number is [phone].

## 2016-03-27 NOTE — Telephone Encounter (Signed)
Discussed with patient, fyi.

## 2016-03-27 NOTE — Progress Notes (Signed)
Daily Group Progress Note Program:  Outpatient  Group Time: 5:30-6:30 pm  Participation Level: Active  Behavioral Response: Appropriate  Type of Therapy: Solution-Focused Therapy  Summary of Progress:  Pt participated in a discussion on the miracle question: What if you woke up this morning and your problem had magically disappeared, what would your life be like now? This question was asked to see what the pt wants her future to be, as well as how the problem manifests in her life now. Pt was able to measure future progress in comparison to what symptoms she is experiencing now. Pt was receptive to intervention and suggestions.   Vernona RiegerLisbeth S. Waddell Iten, LCAS

## 2016-03-27 NOTE — Telephone Encounter (Signed)
Called the patient, left voice message to call back.

## 2016-03-27 NOTE — Telephone Encounter (Signed)
Patients therapist, [name], came by my office to let me know that patient is not doing well on the Wellbutrin. Patient says her anxiety has increased some, but the depression has become severe and overwhelming. Please review and advise, thank you

## 2016-03-27 NOTE — Telephone Encounter (Signed)
Patient reports improved motivation after starting Wellbutrin. She continues to feel depressed and endorses low energy. She reports slight worsening in her anxiety but denies any stress from it.   Will increase Wellbutrin to optimize its effect, given her history of adverse event from other SSRIs/SNRIs. Discussed side effect which includes worsening anxiety.   - Increase Wellbutrin 300 mg daily. Patient is instructed to call the clinic if any worsening in her symptoms/experiencing any side effects.

## 2016-03-28 ENCOUNTER — Ambulatory Visit (INDEPENDENT_AMBULATORY_CARE_PROVIDER_SITE_OTHER): Payer: 59 | Admitting: Licensed Clinical Social Worker

## 2016-03-28 DIAGNOSIS — F411 Generalized anxiety disorder: Secondary | ICD-10-CM | POA: Diagnosis not present

## 2016-03-28 DIAGNOSIS — F331 Major depressive disorder, recurrent, moderate: Secondary | ICD-10-CM | POA: Diagnosis not present

## 2016-03-28 NOTE — Telephone Encounter (Signed)
noted 

## 2016-03-29 ENCOUNTER — Encounter (HOSPITAL_COMMUNITY): Payer: Self-pay | Admitting: Licensed Clinical Social Worker

## 2016-03-29 ENCOUNTER — Telehealth (HOSPITAL_COMMUNITY): Payer: Self-pay | Admitting: Licensed Clinical Social Worker

## 2016-03-29 NOTE — Progress Notes (Signed)
Daily Group Progress Note Program:  Outpatient  Group Time: 5:30-6:30 pm  Participation Level: Active  Behavioral Response: Appropriate  Type of Therapy: Psychoeducation/Group process  Summary of Progress: Pt participated in a discussion on stress coping skills in mental health recovery. 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 Normally, the pt's stress will evolve into anxiety and then depression. Pt was encouraged to use her wellness tools of relaxation and stress reduction techniques to increase her overall wellness. Pt was receptive to the intervention and suggestions.   Vernona RiegerLisbeth S. Latravia Southgate, LCAS

## 2016-04-02 ENCOUNTER — Ambulatory Visit (INDEPENDENT_AMBULATORY_CARE_PROVIDER_SITE_OTHER): Payer: 59 | Admitting: Licensed Clinical Social Worker

## 2016-04-02 DIAGNOSIS — F411 Generalized anxiety disorder: Secondary | ICD-10-CM

## 2016-04-02 DIAGNOSIS — F331 Major depressive disorder, recurrent, moderate: Secondary | ICD-10-CM

## 2016-04-03 ENCOUNTER — Encounter (HOSPITAL_COMMUNITY): Payer: Self-pay | Admitting: Licensed Clinical Social Worker

## 2016-04-03 NOTE — Progress Notes (Signed)
Daily Group Progress Note Program:  Outpatient  Group Time: 5:30-6:30 pm  Participation Level: Active  Behavioral Response: Appropriate  Type of Therapy:  Psychoeducation/Therapy  Summary of Progress:  Pt participated in a discussion about coping skills for anxiety and depression. Pt shared with the group the coping skills that work for her: journaling, meditation, breathing which gave suggestions to other group members. Pt was encouraged to continue using her coping skills. Pt was receptive to suggestions and intervention.  Vernona RiegerLisbeth S. Watt Geiler, LCAS

## 2016-04-05 ENCOUNTER — Ambulatory Visit (HOSPITAL_COMMUNITY): Payer: 59 | Admitting: Licensed Clinical Social Worker

## 2016-04-05 NOTE — Progress Notes (Signed)
BH MD/PA/NP OP Progress Note  04/09/2016 4:39 PM Brooke Fernandez  MRN:  [id number]  Chief Complaint:  Chief Complaint    Depression; Follow-up     Subjective:  "I'm more anxious" HPI:  - Wellbutrin was increased to 300 mg since the last encounter.  She states that she feels better after increasing Wellbutrin. She does work out and spends time with her dog to be active. She feels more comfortable at work. She reports challenges of meeting with her ex-boyfriend at work. She also finds difficulty to interact with other people at work, as she tries to focus on her work. 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 She feels anxious and more disconnected when she is with other people. She denies SI. She drank a vodka on the last weekend at the party. She felt more drowsy and she would try not to drink so much as it can interact with her medication. She comes to a group therapy twice a week and finds it to be very helpful.    Visit Diagnosis:    ICD-9-CM ICD-10-CM   1. Moderate episode of recurrent major depressive disorder (HCC) 296.32 F33.1   2. Generalized anxiety disorder 300.02 F41.1     Past Psychiatric History:  Outpatient: Group therapy in [place], used to [place] in [place] Psychiatry admission: denies Previous suicide attempt: denies Past trials of medication: "all SSRI," Wellbutrin, clonazepam  Patient received pharmacogenemic test, which will be scanned into her chart.  Use as directed- desipramine, duloxetine (pt reports significant side effect), fluvoxamine, nortriptyline, vortioxetine  Past Medical History:  Past Medical History:  Diagnosis Date  . Anxiety   . Depression    No past surgical history on file.  Family Psychiatric History:  Brother- anxiety, takes xanax Mother- depression    Family History:  Family History  Problem Relation Age of Onset  . CAD Maternal Grandmother   . Colon cancer Maternal Grandfather   . Breast cancer Maternal Aunt   .  Depression Mother   . Anxiety disorder Brother   . Depression Brother     Social History:  Social History   Social History  . Marital status: Single    Spouse name: N/A  . Number of children: N/A  . Years of education: N/A   Social History Main Topics  . Smoking status: Never Smoker  . Smokeless tobacco: Never Used  . Alcohol use 3.0 - 6.0 oz/week    5 Shots of liquor per week  . Drug use: No  . Sexual activity: Yes    Birth control/ protection: Pill   Other Topics Concern  . None   Social History Narrative  . None    Allergies: No Known Allergies  Metabolic Disorder Labs: No results found for: HGBA1C, MPG No results found for: PROLACTIN Lab Results  Component Value Date   CHOL 158 11/14/2015   TRIG 96 11/14/2015   HDL 74 11/14/2015   CHOLHDL 2.1 11/14/2015   VLDL 19 11/14/2015   LDLCALC 65 11/14/2015     Current Medications: Current Outpatient Prescriptions  Medication Sig Dispense Refill  . b complex vitamins tablet Take 1 tablet by mouth daily.    . Biotin (BIOTIN MAXIMUM STRENGTH) 10 MG TABS Take by mouth.    Marland Kitchen. buPROPion (WELLBUTRIN XL) 300 MG 24 hr tablet Take 1 tablet (300 mg total) by mouth every morning. 30 tablet 1  . Norethindrone-Ethinyl Estradiol-Fe Biphas (LO LOESTRIN FE) 1 MG-10 MCG / 10 MCG tablet Take 1 tablet  by mouth daily. 1 Package 11  . Specialty Vitamins Products (ADVANCED COLLAGEN) TABS Take 600 mg by mouth.     No current facility-administered medications for this visit.     Neurologic: Headache: No Seizure: No Paresthesias: No  Musculoskeletal: Strength & Muscle Tone: within normal limits Gait & Station: normal Patient leans: N/A  Psychiatric Specialty Exam: Review of Systems  Psychiatric/Behavioral: Positive for depression. Negative for hallucinations, substance abuse and suicidal ideas. The patient is nervous/anxious.   All other systems reviewed and are negative.   Blood pressure 102/64, pulse 73, height 5\' 4"  (1.626  m), weight 131 lb 9.6 oz (59.7 kg).Body mass index is 22.59 kg/m.  General Appearance: Well Groomed  Eye Contact:  Good  Speech:  Clear and Coherent  Volume:  Normal  Mood:  "better but anxious"  Affect:  Restricted  Thought Process:  Coherent and Goal Directed  Orientation:  Full (Time, Place, and Person)  Thought Content: Logical Perceptions: denies AH/VH  Suicidal Thoughts:  No  Homicidal Thoughts:  No  Memory:  Immediate;   Good Recent;   Good Remote;   Good  Judgement:  Good  Insight:  Good  Psychomotor Activity:  Normal  Concentration:  Concentration: Good and Attention Span: Good  Recall:  Good  Fund of Knowledge: Good  Language: Good  Akathisia:  No  Handed:  Right  AIMS (if indicated):  N/A  Assets:  Communication Skills Desire for Improvement  ADL's:  Intact  Cognition: WNL  Sleep:  fair   Assessment Brooke Fernandez is a 30 year old female with anxiety, depression, alcohol use who presented for follow up appointment. Psychosocial stressors including breaking up with her boyfriend. Of note, patient has a history of multiple adverse reaction to antidepressants as described above.   # MDD # GAD Patient reports improvement in her neurovegetative symptoms since uptitrating Wellbutrin. However, she also endorses social anxiety; after discussion in length, will plan to continue the current regimen with the hope that she would be able to build/apply coping skills she learned from group therapy. Also discussed her core belief of failure and cognitive distortions which includes black and white thinking.   # Alcohol use Patient is motivated for sobriety and denies excessive regular drinking at today's visit. Will continue motivational interview. She may benefit from gabapentin for sobriety/anxiety in the future.   Plan 1. Continue Wellbutrin 300 mg daily  2. Return to clinic in January 3. Patient to continue group therapy  The patient demonstrates the following risk  factors for suicide: Chronic risk factors for suicide include: psychiatric disorder of depression, anxiety. Acute risk factors for suicide include: loss (financial, interpersonal, professional). Protective factors for this patient include: positive social support, positive therapeutic relationship, coping skills and hope for the future. Considering these factors, the overall suicide risk at this point appears to be low. Patient is appropriate for outpatient follow up.  Treatment Plan Summary:Plan as above  Neysa Hottereina Sierah Lacewell, MD 04/09/2016, 4:39 PM

## 2016-04-09 ENCOUNTER — Encounter (HOSPITAL_COMMUNITY): Payer: Self-pay | Admitting: Psychiatry

## 2016-04-09 ENCOUNTER — Ambulatory Visit (INDEPENDENT_AMBULATORY_CARE_PROVIDER_SITE_OTHER): Payer: 59 | Admitting: Licensed Clinical Social Worker

## 2016-04-09 ENCOUNTER — Ambulatory Visit (INDEPENDENT_AMBULATORY_CARE_PROVIDER_SITE_OTHER): Payer: 59 | Admitting: Psychiatry

## 2016-04-09 VITALS — BP 102/64 | HR 73 | Ht 64.0 in | Wt 131.6 lb

## 2016-04-09 DIAGNOSIS — F411 Generalized anxiety disorder: Secondary | ICD-10-CM

## 2016-04-09 DIAGNOSIS — Z79899 Other long term (current) drug therapy: Secondary | ICD-10-CM

## 2016-04-09 DIAGNOSIS — Z818 Family history of other mental and behavioral disorders: Secondary | ICD-10-CM

## 2016-04-09 DIAGNOSIS — Z800 Family history of malignant neoplasm of digestive organs: Secondary | ICD-10-CM | POA: Diagnosis not present

## 2016-04-09 DIAGNOSIS — F331 Major depressive disorder, recurrent, moderate: Secondary | ICD-10-CM

## 2016-04-09 DIAGNOSIS — Z803 Family history of malignant neoplasm of breast: Secondary | ICD-10-CM

## 2016-04-09 MED ORDER — BUPROPION HCL ER (XL) 300 MG PO TB24: 300.0000 mg | ORAL_TABLET | ORAL | 1 refills | Status: DC

## 2016-04-09 NOTE — Patient Instructions (Signed)
1. Continue Wellbutrin 300 mg daily  2. Return to clinic in January

## 2016-04-10 ENCOUNTER — Encounter (HOSPITAL_COMMUNITY): Payer: Self-pay | Admitting: Licensed Clinical Social Worker

## 2016-04-10 NOTE — Progress Notes (Signed)
Daily Group Progress Note Program:  Outpatient  Group Time: 5:30-6:30 pm  Participation Level: Active  Behavioral Response: Appropriate  Type of Therapy:  Psychoeducation/Therapy  Summary of Progress: Pt participated in a discussion on relationships in mental health recovery. Pt completed an activity on "The Relationship Circle": intimate, close, and acquaintances. A discussion ensued on the placement in the circle of people allowed in her life. The discussion continued where patient showed how some people move around in different circles so that she may protect herself. Pt was encouraged to continue to use boundaries with people in her life for healthy self-care.  Lisbeth S. Mackenzie, LCAS  

## 2016-04-11 ENCOUNTER — Ambulatory Visit (HOSPITAL_COMMUNITY): Payer: Self-pay | Admitting: Licensed Clinical Social Worker

## 2016-04-16 ENCOUNTER — Ambulatory Visit (INDEPENDENT_AMBULATORY_CARE_PROVIDER_SITE_OTHER): Payer: 59 | Admitting: Licensed Clinical Social Worker

## 2016-04-16 DIAGNOSIS — F331 Major depressive disorder, recurrent, moderate: Secondary | ICD-10-CM | POA: Diagnosis not present

## 2016-04-16 DIAGNOSIS — F411 Generalized anxiety disorder: Secondary | ICD-10-CM | POA: Diagnosis not present

## 2016-04-17 ENCOUNTER — Encounter (HOSPITAL_COMMUNITY): Payer: Self-pay | Admitting: Licensed Clinical Social Worker

## 2016-04-17 NOTE — Progress Notes (Signed)
Daily Group Progress Note Program:  Outpatient  Group Time: 5:30-6:30 pm  Participation Level: Active  Behavioral Response: Appropriate  Type of Therapy:  Psychoeducation/Therapy  Summary of Progress:  Patient participated in a group discussion on "Surviving family during the holidays while working on my own mental wellness." The holidays can be enjoyable but can be filled with increased tension and encounters with people who can be triggering. Pt pointed out that her normal reactionary behavior often caused more problems during holidays, but was able to remove herself from the festivities to process and then was able to return in a thoughtful and responsible way. Pt was encouraged to continue to use these behaviors in future family conflicts that may be triggering. Pt was engaged during the intervention.  Clarance Bollard S. Kimara Bencomo, LCAS   

## 2016-04-18 ENCOUNTER — Ambulatory Visit (HOSPITAL_COMMUNITY): Payer: 59 | Admitting: Licensed Clinical Social Worker

## 2016-04-18 DIAGNOSIS — F411 Generalized anxiety disorder: Secondary | ICD-10-CM

## 2016-04-18 DIAGNOSIS — F331 Major depressive disorder, recurrent, moderate: Secondary | ICD-10-CM

## 2016-04-19 ENCOUNTER — Encounter (HOSPITAL_COMMUNITY): Payer: Self-pay | Admitting: Licensed Clinical Social Worker

## 2016-04-19 NOTE — Progress Notes (Signed)
Daily Group Progress Note Program:  Outpatient  Group Time: 5:30-6:30 pm  Participation Level: Active  Behavioral Response: Appropriate  Type of Therapy:  Psychoeducation/Therapy  Summary of Progress: Pt participated in an art activity led by one of the group members who is an art therapist. Using art to help in the recovery process of mental illness can be effective in exploring and expressing feelings and improving overall well-being. One art activity concentrated on expressing emotions; the other art activity depicted: "How do I see myself?' Pt was encouraged to continue expressing her emotions in a healthy manner and continue to create positive images of herself through self-esteem coping skills.  Lisbeth S. Mackenzie, LCAS  

## 2016-04-23 ENCOUNTER — Ambulatory Visit (INDEPENDENT_AMBULATORY_CARE_PROVIDER_SITE_OTHER): Payer: 59 | Admitting: Licensed Clinical Social Worker

## 2016-04-23 DIAGNOSIS — F331 Major depressive disorder, recurrent, moderate: Secondary | ICD-10-CM

## 2016-04-23 DIAGNOSIS — F411 Generalized anxiety disorder: Secondary | ICD-10-CM | POA: Diagnosis not present

## 2016-04-24 ENCOUNTER — Encounter (HOSPITAL_COMMUNITY): Payer: Self-pay | Admitting: Licensed Clinical Social Worker

## 2016-04-24 NOTE — Progress Notes (Signed)
Daily Group Progress Note Program:  Outpatient  Group Time: 5:30-6:30 pm Participation Level: Active Behavioral Response: Appropriate Type of Therapy:  Psychoeducation/Therapy Summary of Progress: Pt participated in a discussion "Sitting with uncomfortable feelings," which means sitting in the discomfort and waiting before acting. Pt shared she feels she always needs to act based on her emotions, often making damaging decisions. Pt was encouraged to continue to develop her emotional intelligence, learn to sit with her negative feelings and create situations for positive feelings. Pt was receptive to the suggestion during the intervention.  Javiel Canepa S. Jiyan Walkowski, LCAS 04/23/16 

## 2016-04-25 ENCOUNTER — Ambulatory Visit (HOSPITAL_COMMUNITY): Payer: 59 | Admitting: Licensed Clinical Social Worker

## 2016-04-25 DIAGNOSIS — F331 Major depressive disorder, recurrent, moderate: Secondary | ICD-10-CM

## 2016-04-25 DIAGNOSIS — F411 Generalized anxiety disorder: Secondary | ICD-10-CM

## 2016-04-26 ENCOUNTER — Encounter (HOSPITAL_COMMUNITY): Payer: Self-pay | Admitting: Licensed Clinical Social Worker

## 2016-04-26 NOTE — Progress Notes (Signed)
Daily Group Progress Note Program:  Outpatient  Group Time: 5:30-6:30 pm Participation Level: Active Behavioral Response: Appropriate Type of Therapy:  Psychoeducation/Therapy Summary of Progress: Pt participated in a discussion on feeling emotionally overwhelmed. Pt expressed her state of being with intense emotions that are difficult to manage, affect her ability to think and act rationally or perform in a sufficient and functional manner. Pt was encouraged to work through key issues, trying to discover the roots of her overwhelming emotions and explore ways to prevent her emotions from becoming overwhelming, and use her coping skills to deal with potential emotional stressors that cannot be prevented. Pt was open to suggestions during the intervention.  Lisbeth S. Mackenzie, LCAS  

## 2016-04-30 ENCOUNTER — Ambulatory Visit (INDEPENDENT_AMBULATORY_CARE_PROVIDER_SITE_OTHER): Payer: 59 | Admitting: Licensed Clinical Social Worker

## 2016-04-30 DIAGNOSIS — F331 Major depressive disorder, recurrent, moderate: Secondary | ICD-10-CM

## 2016-04-30 DIAGNOSIS — F411 Generalized anxiety disorder: Secondary | ICD-10-CM | POA: Diagnosis not present

## 2016-05-01 ENCOUNTER — Encounter (HOSPITAL_COMMUNITY): Payer: Self-pay | Admitting: Licensed Clinical Social Worker

## 2016-05-01 NOTE — Progress Notes (Signed)
Daily Group Progress Note Program:  Outpatient  Group Time: 5:30-6:30 pm Participation Level: Active Behavioral Response: Appropriate Type of Therapy:  Psychoeducation/Therapy Summary of Progress: Pt participated in a discussion on how others may affect  her emotional health by affecting her thoughts, feelings and even behaviors. Pt struggles with letting others get too close too quickly and then becoming disappointed. Pt was encouraged to continue using her healthy ways (boundaries) to cope with stress and problems that are a normal part of life.   Vernona RiegerLisbeth S. Khalila Buechner, LCAS

## 2016-05-02 ENCOUNTER — Ambulatory Visit (INDEPENDENT_AMBULATORY_CARE_PROVIDER_SITE_OTHER): Payer: 59 | Admitting: Licensed Clinical Social Worker

## 2016-05-02 DIAGNOSIS — F411 Generalized anxiety disorder: Secondary | ICD-10-CM

## 2016-05-02 DIAGNOSIS — F331 Major depressive disorder, recurrent, moderate: Secondary | ICD-10-CM | POA: Diagnosis not present

## 2016-05-07 ENCOUNTER — Ambulatory Visit (HOSPITAL_COMMUNITY): Payer: Self-pay | Admitting: Licensed Clinical Social Worker

## 2016-05-08 ENCOUNTER — Encounter (HOSPITAL_COMMUNITY): Payer: Self-pay | Admitting: Licensed Clinical Social Worker

## 2016-05-08 NOTE — Progress Notes (Signed)
Daily Group Progress Note Program:  Outpatient   Group Time: 5:30-6:30 pm  Participation Level: Active  Behavioral Response: Appropriate  Type of Therapy:  Psychoeducation/Therapy  Summary of Progress: Pt participated in a discussion on identifying and coping with new feelings in mental health recovery. Pt was encouraged to continue to talk about her feelings in group, journal her thoughts and feelings and continue to use her effective coping skills.    Lisbeth S. Mackenzie, LCAS 

## 2016-05-09 ENCOUNTER — Telehealth (HOSPITAL_COMMUNITY): Payer: Self-pay | Admitting: Licensed Clinical Social Worker

## 2016-05-09 ENCOUNTER — Ambulatory Visit (INDEPENDENT_AMBULATORY_CARE_PROVIDER_SITE_OTHER): Payer: 59 | Admitting: Licensed Clinical Social Worker

## 2016-05-09 DIAGNOSIS — F331 Major depressive disorder, recurrent, moderate: Secondary | ICD-10-CM | POA: Diagnosis not present

## 2016-05-09 DIAGNOSIS — F411 Generalized anxiety disorder: Secondary | ICD-10-CM

## 2016-05-10 ENCOUNTER — Encounter (HOSPITAL_COMMUNITY): Payer: Self-pay | Admitting: Licensed Clinical Social Worker

## 2016-05-10 NOTE — Progress Notes (Signed)
Daily Group Progress Note Program:  Outpatient   Group Time: 5:30-6:30 pm  Participation Level: Active  Behavioral Response: Appropriate  Type of Therapy:  Psychoeducation/Therapy  Summary of Progress:  Pt participated in a discussion of mental wellness during the holiday season. It was noted during the discussion that anxiety and depression may become more prevalent during the Christmas holiday especially people who already live with a mental health condition. Pt was encouraged to take extra care in tending to her overall health and wellness during this time. Rojean Ige S. Jontue Crumpacker, LCAS 

## 2016-05-16 ENCOUNTER — Ambulatory Visit (INDEPENDENT_AMBULATORY_CARE_PROVIDER_SITE_OTHER): Payer: 59 | Admitting: Licensed Clinical Social Worker

## 2016-05-16 DIAGNOSIS — F411 Generalized anxiety disorder: Secondary | ICD-10-CM | POA: Diagnosis not present

## 2016-05-16 DIAGNOSIS — F331 Major depressive disorder, recurrent, moderate: Secondary | ICD-10-CM | POA: Diagnosis not present

## 2016-05-17 ENCOUNTER — Encounter (HOSPITAL_COMMUNITY): Payer: Self-pay | Admitting: Licensed Clinical Social Worker

## 2016-05-17 NOTE — Progress Notes (Signed)
Daily Group Progress Note Program:  Outpatient  Group Time: 5:30-6:30 pm  Participation Level: Active  Behavioral Response: Appropriate  Type of Therapy:  Psychoeducation/Therapy  Summary of Progress: Pt participated in a discussion about resiliency, her ability to remain stable and maintain healthy levels of psychological and physical functioning in the face of disruption and chaos. Pt was encouraged to continue to harness her internal locus of control, rather than outside sources, to control her own life.  Lisbeth S. Mackenzie, LCAS  

## 2016-05-23 ENCOUNTER — Ambulatory Visit (INDEPENDENT_AMBULATORY_CARE_PROVIDER_SITE_OTHER): Payer: 59 | Admitting: Licensed Clinical Social Worker

## 2016-05-23 DIAGNOSIS — F411 Generalized anxiety disorder: Secondary | ICD-10-CM

## 2016-05-23 DIAGNOSIS — F331 Major depressive disorder, recurrent, moderate: Secondary | ICD-10-CM

## 2016-05-24 ENCOUNTER — Encounter (HOSPITAL_COMMUNITY): Payer: Self-pay | Admitting: Licensed Clinical Social Worker

## 2016-05-24 NOTE — Progress Notes (Signed)
Daily Group Progress Note Program:  Outpatient  Group Time: 5:30-6:30 pm  Participation Level: Active  Behavioral Response: Appropriate  Type of Therapy:  Psychoeducation/Therapy  Summary of Progress: Pt participated in a discussion entitled "What is your word for the new year, from the book One Word that Will Change your Life?" Pt shared how one word can create clarity, power, passion and life-change. One word - that impacts all dimensions of her life - mental, physical, emotional relational and spiritual. Her word was hopeful. Pt was encouraged to find small successes during the year by using her "word" and a roadmap. Pt was an active participant during the intervention and open to suggestions made by group members.  Vernona RiegerLisbeth S. Shamarion Coots, LCAS

## 2016-05-28 ENCOUNTER — Ambulatory Visit (HOSPITAL_COMMUNITY): Payer: 59 | Admitting: Licensed Clinical Social Worker

## 2016-05-28 DIAGNOSIS — F331 Major depressive disorder, recurrent, moderate: Secondary | ICD-10-CM

## 2016-05-28 DIAGNOSIS — F411 Generalized anxiety disorder: Secondary | ICD-10-CM

## 2016-05-29 ENCOUNTER — Encounter (HOSPITAL_COMMUNITY): Payer: Self-pay | Admitting: Licensed Clinical Social Worker

## 2016-05-29 NOTE — Progress Notes (Signed)
Daily Group Progress Note Program:  Outpatient   Group Time: 5:30-6:30 pm  Participation Level: Active  Behavioral Response: Appropriate  Type of Therapy:  Psychoeducation/Therapy  Summary of Progress:  Pt participated in a discussion on the mind/body connection. Pt recognized that being stressed, anxious or upset by others affects her own emotional well-being. Pt was able to identify ways she could improve her emotional health by recognizing her emotions and understanding why she is having them and what she has control over to have the ability to change. Pt actively participated in the intervention.  Erroll Wilbourne S. Gonzalo Waymire, LCAS 

## 2016-05-30 ENCOUNTER — Ambulatory Visit (INDEPENDENT_AMBULATORY_CARE_PROVIDER_SITE_OTHER): Payer: 59 | Admitting: Licensed Clinical Social Worker

## 2016-05-30 DIAGNOSIS — F411 Generalized anxiety disorder: Secondary | ICD-10-CM

## 2016-05-30 DIAGNOSIS — F331 Major depressive disorder, recurrent, moderate: Secondary | ICD-10-CM

## 2016-05-31 ENCOUNTER — Encounter (HOSPITAL_COMMUNITY): Payer: Self-pay | Admitting: Licensed Clinical Social Worker

## 2016-05-31 NOTE — Progress Notes (Signed)
Daily Group Progress Note Program:  Outpatient  Group Time: 5:30-6:30 pm  Participation Level: Active  Behavioral Response: Appropriate  Type of Therapy:  Psychoeducation/Therapy  Summary of Progress: Pt participated in a discussion on "finding purpose in my life" during mental health recovery. While suffering from a mental illness pt must satisfy herself spiritually, emotionally and mentally. The recovery process itself sparks passion to inspire growth. Pt was encouraged to continue to work on her self-discovery which plays a role in her recovery process. Pt was actively engaged in the intervention.    Weslie Rasmus S. Caydee Talkington, LCAS 

## 2016-06-03 NOTE — Progress Notes (Signed)
BH MD/PA/NP OP Progress Note  06/04/2016 4:35 PM Brooke Fernandez  MRN:  [id number]  Chief Complaint:  Chief Complaint    Depression; Anxiety     Subjective:  "I'm fine" HPI:  Patient presents for follow up appointment. She had depressive episode (feeling depressed, anhedonia and low energy) yesterday without significant trigger, and has been self conscious. She feels that it affects "all aspects of my life" and feels disturbed by it. She feels anxious when she was asked by a doctor to do something at work; she was freezed. Although other people helps her and she knows that she is still at learning curve, she feels more stress as she wants to be perfect. She tries to be mindful of her alcohol use and has been drinking a few glasses of wine on weekends. She denies SI. She finds group therapy to be helpful.   Visit Diagnosis:    ICD-9-CM ICD-10-CM   1. Moderate episode of recurrent major depressive disorder (HCC) 296.32 F33.1     Past Psychiatric History:  Outpatient: Group therapy in [place], [place] Psychiatry admission: denies Previous suicide attempt: denies Past trials of medication: "all SSRI," Wellbutrin, clonazepam  Patient received pharmacogenetic test, which will be scanned into her chart.  Use as directed- desipramine, duloxetine (pt reports significant side effect), fluvoxamine, nortriptyline, vortioxetine  Past Medical History:  Past Medical History:  Diagnosis Date  . Anxiety   . Depression    History reviewed. No pertinent surgical history.  Family Psychiatric History:  Brother- anxiety, takes xanax Mother- depression    Family History:  Family History  Problem Relation Age of Onset  . CAD Maternal Grandmother   . Colon cancer Maternal Grandfather   . Breast cancer Maternal Aunt   . Depression Mother   . Anxiety disorder Brother   . Depression Brother     Social History:  Social History   Social History  . Marital  status: Single    Spouse name: N/A  . Number of children: N/A  . Years of education: N/A   Social History Main Topics  . Smoking status: Never Smoker  . Smokeless tobacco: Never Used  . Alcohol use 3.0 - 6.0 oz/week    5 Shots of liquor per week  . Drug use: No  . Sexual activity: Yes    Birth control/ protection: Pill   Other Topics Concern  . None   Social History Narrative  . None    Allergies: No Known Allergies  Metabolic Disorder Labs: No results found for: HGBA1C, MPG No results found for: PROLACTIN Lab Results  Component Value Date   CHOL 158 11/14/2015   TRIG 96 11/14/2015   HDL 74 11/14/2015   CHOLHDL 2.1 11/14/2015   VLDL 19 11/14/2015   LDLCALC 65 11/14/2015     Current Medications: Current Outpatient Prescriptions  Medication Sig Dispense Refill  . b complex vitamins tablet Take 1 tablet by mouth daily.    . Biotin (BIOTIN MAXIMUM STRENGTH) 10 MG TABS Take by mouth.    Marland Kitchen. buPROPion (WELLBUTRIN XL) 300 MG 24 hr tablet Take 1 tablet (300 mg total) by mouth every morning. 30 tablet 1  . Norethindrone-Ethinyl Estradiol-Fe Biphas (LO LOESTRIN FE) 1 MG-10 MCG / 10 MCG tablet Take 1 tablet by mouth daily. 1 Package 11  . Specialty Vitamins Products (ADVANCED COLLAGEN) TABS Take 600 mg by mouth.    Marland Kitchen. buPROPion (WELLBUTRIN XL) 150 MG 24 hr tablet Take total of  450 mg (150 mg + 300 mg) 30 tablet 1   No current facility-administered medications for this visit.     Neurologic: Headache: No Seizure: No Paresthesias: No  Musculoskeletal: Strength & Muscle Tone: within normal limits Gait & Station: normal Patient leans: N/A  Psychiatric Specialty Exam: Review of Systems  Psychiatric/Behavioral: Positive for depression. Negative for hallucinations, substance abuse and suicidal ideas. The patient is nervous/anxious.   All other systems reviewed and are negative.   Blood pressure 112/64, pulse 79, height 5\' 4"  (1.626 m), weight 131 lb 9.6 oz (59.7 kg).Body  mass index is 22.59 kg/m.  General Appearance: Well Groomed  Eye Contact:  Good  Speech:  Clear and Coherent  Volume:  Normal  Mood:  "fine"  Affect:  Depressed and Restricted  Thought Process:  Coherent and Goal Directed  Orientation:  Full (Time, Place, and Person)  Thought Content: Logical Perceptions: denies AH/VH  Suicidal Thoughts:  No  Homicidal Thoughts:  No  Memory:  Immediate;   Good Recent;   Good Remote;   Good  Judgement:  Good  Insight:  Good  Psychomotor Activity:  Normal  Concentration:  Concentration: Good and Attention Span: Good  Recall:  Good  Fund of Knowledge: Good  Language: Good  Akathisia:  No  Handed:  Right  AIMS (if indicated):  N/A  Assets:  Communication Skills Desire for Improvement  ADL's:  Intact  Cognition: WNL  Sleep:  fair   Assessment Brooke Fernandez is a 31 year old female with anxiety, depression, alcohol use who presented for follow up appointment. Psychosocial stressors including breaking up with her boyfriend. # MDD # GAD Today's evaluation is notable for her restricted affect and she endorses neurovegetative symptoms. Patient is offered an option of increasing wellbutrin; she will do it if she continues to feel depressed. Discussed potential side effects of worsening anxiety and headache. Noted that we have not been able to add SSRI due to her history of adverse reaction to antidepressants as described above. May consider adding antipsychotics to target her mood in the future if she has limited response to uptitration of wellbutrin. Patient will greatly benefit from CBT; patient to continue group therapy.   # Alcohol use Patient is motivated for sobriety and denies excessive regular drinking at today's visit. Will continue motivational interview. She may benefit from gabapentin for sobriety/anxiety in the future.   Plan 1. Increase Wellbutrin to 450 mg (300 mg + 150 mg) 2. Return to clinic in one month 3. Patient to continue group  therapy  The patient demonstrates the following risk factors for suicide: Chronic risk factors for suicide include: psychiatric disorder of depression, anxiety. Acute risk factors for suicide include: loss (financial, interpersonal, professional). Protective factors for this patient include: positive social support, positive therapeutic relationship, coping skills and hope for the future. Considering these factors, the overall suicide risk at this point appears to be low. Patient is appropriate for outpatient follow up.  Treatment Plan Summary:Plan as above  Neysa Hotter, MD 06/04/2016, 4:35 PM

## 2016-06-04 ENCOUNTER — Ambulatory Visit (INDEPENDENT_AMBULATORY_CARE_PROVIDER_SITE_OTHER): Payer: 59 | Admitting: Licensed Clinical Social Worker

## 2016-06-04 ENCOUNTER — Encounter (HOSPITAL_COMMUNITY): Payer: Self-pay | Admitting: Psychiatry

## 2016-06-04 ENCOUNTER — Ambulatory Visit (INDEPENDENT_AMBULATORY_CARE_PROVIDER_SITE_OTHER): Payer: 59 | Admitting: Psychiatry

## 2016-06-04 VITALS — BP 112/64 | HR 79 | Ht 64.0 in | Wt 131.6 lb

## 2016-06-04 DIAGNOSIS — Z818 Family history of other mental and behavioral disorders: Secondary | ICD-10-CM | POA: Diagnosis not present

## 2016-06-04 DIAGNOSIS — Z803 Family history of malignant neoplasm of breast: Secondary | ICD-10-CM | POA: Diagnosis not present

## 2016-06-04 DIAGNOSIS — Z800 Family history of malignant neoplasm of digestive organs: Secondary | ICD-10-CM

## 2016-06-04 DIAGNOSIS — F331 Major depressive disorder, recurrent, moderate: Secondary | ICD-10-CM

## 2016-06-04 DIAGNOSIS — Z79899 Other long term (current) drug therapy: Secondary | ICD-10-CM

## 2016-06-04 DIAGNOSIS — F411 Generalized anxiety disorder: Secondary | ICD-10-CM | POA: Diagnosis not present

## 2016-06-04 MED ORDER — BUPROPION HCL ER (XL) 300 MG PO TB24
300.0000 mg | ORAL_TABLET | ORAL | 1 refills | Status: DC
Start: 2016-06-04 — End: 2016-07-18

## 2016-06-04 MED ORDER — BUPROPION HCL ER (XL) 150 MG PO TB24: ORAL_TABLET | ORAL | 1 refills | Status: DC

## 2016-06-04 NOTE — Patient Instructions (Signed)
1. Increase Wellbutrin to 450 mg (300 mg + 150 mg) 2. Return to clinic in one month

## 2016-06-06 ENCOUNTER — Ambulatory Visit (HOSPITAL_COMMUNITY): Payer: Self-pay | Admitting: Licensed Clinical Social Worker

## 2016-06-07 ENCOUNTER — Encounter (HOSPITAL_COMMUNITY): Payer: Self-pay | Admitting: Licensed Clinical Social Worker

## 2016-06-07 NOTE — Progress Notes (Signed)
Daily Group Progress Note Program:  Outpatient  Group Time: 5:30-6:30 pm Participation Level: Active Behavioral Response: Appropriate Type of Therapy:  Psychoeducation/Therapy Summary of Progress: Pt participated in a discussion on personal responsibility as one of the key concepts to recovery. Pt has been able to take action and do what needs to get done to get well and stay well. It was suggested to pt that taking ownership of her life and future is her first step to gain back control of her life. Pt was active during the intervention and was open to suggestions.  Vernona RiegerLisbeth S. Mackenzie, LCAS

## 2016-06-11 ENCOUNTER — Ambulatory Visit (INDEPENDENT_AMBULATORY_CARE_PROVIDER_SITE_OTHER): Payer: 59 | Admitting: Licensed Clinical Social Worker

## 2016-06-11 DIAGNOSIS — F411 Generalized anxiety disorder: Secondary | ICD-10-CM | POA: Diagnosis not present

## 2016-06-11 DIAGNOSIS — F331 Major depressive disorder, recurrent, moderate: Secondary | ICD-10-CM

## 2016-06-12 ENCOUNTER — Encounter (HOSPITAL_COMMUNITY): Payer: Self-pay | Admitting: Licensed Clinical Social Worker

## 2016-06-12 NOTE — Progress Notes (Signed)
Daily Group Progress Note Program:  Outpatient  Group Time: 5:30-6:30 pm  Participation Level: Active  Behavioral Response: Appropriate  Type of Therapy: Psychoeducation/Therapy  Summary of Progress: Pt participated in a discussion on "Moving forward in change" during mental health recovery. Mental health recovery can be a challenge or even stalled but "I must always be open to change" by not letting the mental health diagnosis define me. Pt was encouraged to continue to come to a place of acceptance. Without acceptance, there is no peace. The first step in recovery. Pt was engaged in the intervention.  Lisbeth S. Mackenzie, LCAS      

## 2016-06-13 ENCOUNTER — Ambulatory Visit (INDEPENDENT_AMBULATORY_CARE_PROVIDER_SITE_OTHER): Payer: 59 | Admitting: Licensed Clinical Social Worker

## 2016-06-13 DIAGNOSIS — F331 Major depressive disorder, recurrent, moderate: Secondary | ICD-10-CM

## 2016-06-13 DIAGNOSIS — F411 Generalized anxiety disorder: Secondary | ICD-10-CM

## 2016-06-14 ENCOUNTER — Encounter (HOSPITAL_COMMUNITY): Payer: Self-pay | Admitting: Licensed Clinical Social Worker

## 2016-06-14 NOTE — Progress Notes (Signed)
Daily Group Progress Note Program:  Outpatient  Group Time: 5:30-6:30 pm Participation Level: Active Behavioral Response: Appropriate Type of Therapy:  Psychoeducation/Therapy Summary of Progress: Pt participated in a discussion on anger. Pt participated in the "umbrella activity" where she identified feelings where she reacted using anger.  Pt was encouraged to continue using her healthy ways to cope with anger as a normal part of life. Pt actively participated in the intervention.  Judieth Mckown S. Trigo Winterbottom, LCAS  

## 2016-06-18 ENCOUNTER — Ambulatory Visit (INDEPENDENT_AMBULATORY_CARE_PROVIDER_SITE_OTHER): Payer: 59 | Admitting: Licensed Clinical Social Worker

## 2016-06-18 DIAGNOSIS — F411 Generalized anxiety disorder: Secondary | ICD-10-CM | POA: Diagnosis not present

## 2016-06-18 DIAGNOSIS — F331 Major depressive disorder, recurrent, moderate: Secondary | ICD-10-CM | POA: Diagnosis not present

## 2016-06-20 ENCOUNTER — Ambulatory Visit (HOSPITAL_COMMUNITY): Payer: Self-pay | Admitting: Licensed Clinical Social Worker

## 2016-06-20 ENCOUNTER — Encounter (HOSPITAL_COMMUNITY): Payer: Self-pay | Admitting: Licensed Clinical Social Worker

## 2016-06-20 NOTE — Progress Notes (Signed)
Daily Group Progress Note Program:  Outpatient  Group Time: 5:30-6:30 pm  Participation Level: Active  Behavioral Response: Appropriate  Type of Therapy:  Psychoeducation/Therapy  Summary of Progress: Pt participated in a discussion on "Staying focused while in mental health recovery."  Pt chose recovery and set goals to strive for; however, she has encountered obstacles that have tried to interfere with her road to recovery. Pt was encouraged to stay focused on her recovery even amidst hardships and hindrances. Pt was open to suggestions during the intervention. Brnadon Eoff S. Hildegard Hlavac, LCAS 

## 2016-06-25 ENCOUNTER — Ambulatory Visit (HOSPITAL_COMMUNITY): Payer: Self-pay | Admitting: Licensed Clinical Social Worker

## 2016-06-26 ENCOUNTER — Ambulatory Visit (INDEPENDENT_AMBULATORY_CARE_PROVIDER_SITE_OTHER): Payer: 59 | Admitting: Licensed Clinical Social Worker

## 2016-06-26 DIAGNOSIS — F331 Major depressive disorder, recurrent, moderate: Secondary | ICD-10-CM

## 2016-06-27 ENCOUNTER — Ambulatory Visit (HOSPITAL_COMMUNITY): Payer: Self-pay | Admitting: Licensed Clinical Social Worker

## 2016-06-27 ENCOUNTER — Encounter (HOSPITAL_COMMUNITY): Payer: Self-pay | Admitting: Licensed Clinical Social Worker

## 2016-06-27 NOTE — Progress Notes (Signed)
Daily Group Progress Note Program:  Outpatient  Group Time: 5:30-6:30 pm  Participation Level: Active  Behavioral Response: Appropriate  Type of Therapy:  Psychoeducation/Therapy  Summary of Progress:  Pt participated in a discussion on 4 personal steps to being happy and stable in mental health recovery (recognition, understanding, triggers identified, healthy changes). Pt identified where she is on her journey to being happy and stable in her road to healthy changes in mental health recovery. She reports that she is working on healthy changes in her life by not continuing to engage in toxic relationships and focus more on self care. Pt was engaged in the intervention.  Vernona RiegerLisbeth S. Tykira Wachs, LCAS

## 2016-07-02 ENCOUNTER — Ambulatory Visit (HOSPITAL_COMMUNITY): Payer: Self-pay | Admitting: Licensed Clinical Social Worker

## 2016-07-03 ENCOUNTER — Ambulatory Visit (HOSPITAL_COMMUNITY): Payer: Self-pay | Admitting: Licensed Clinical Social Worker

## 2016-07-04 ENCOUNTER — Ambulatory Visit (HOSPITAL_COMMUNITY): Payer: Self-pay | Admitting: Licensed Clinical Social Worker

## 2016-07-09 ENCOUNTER — Ambulatory Visit (HOSPITAL_COMMUNITY): Payer: Self-pay | Admitting: Licensed Clinical Social Worker

## 2016-07-10 ENCOUNTER — Ambulatory Visit (HOSPITAL_COMMUNITY): Payer: Self-pay | Admitting: Licensed Clinical Social Worker

## 2016-07-11 ENCOUNTER — Ambulatory Visit (HOSPITAL_COMMUNITY): Payer: Self-pay | Admitting: Licensed Clinical Social Worker

## 2016-07-11 ENCOUNTER — Ambulatory Visit (HOSPITAL_COMMUNITY): Payer: Self-pay | Admitting: Psychiatry

## 2016-07-16 ENCOUNTER — Ambulatory Visit (HOSPITAL_COMMUNITY): Payer: Self-pay | Admitting: Licensed Clinical Social Worker

## 2016-07-17 ENCOUNTER — Ambulatory Visit (HOSPITAL_COMMUNITY): Payer: Self-pay | Admitting: Licensed Clinical Social Worker

## 2016-07-18 ENCOUNTER — Ambulatory Visit (INDEPENDENT_AMBULATORY_CARE_PROVIDER_SITE_OTHER): Payer: 59 | Admitting: Psychiatry

## 2016-07-18 ENCOUNTER — Encounter (HOSPITAL_COMMUNITY): Payer: Self-pay | Admitting: Psychiatry

## 2016-07-18 VITALS — BP 112/68 | HR 80 | Ht 64.0 in | Wt 130.6 lb

## 2016-07-18 DIAGNOSIS — F331 Major depressive disorder, recurrent, moderate: Secondary | ICD-10-CM

## 2016-07-18 DIAGNOSIS — Z79899 Other long term (current) drug therapy: Secondary | ICD-10-CM

## 2016-07-18 DIAGNOSIS — Z818 Family history of other mental and behavioral disorders: Secondary | ICD-10-CM | POA: Diagnosis not present

## 2016-07-18 DIAGNOSIS — F509 Eating disorder, unspecified: Secondary | ICD-10-CM

## 2016-07-18 MED ORDER — BUPROPION HCL ER (XL) 150 MG PO TB24: ORAL_TABLET | ORAL | 1 refills | Status: DC

## 2016-07-18 MED ORDER — BUPROPION HCL ER (XL) 300 MG PO TB24: 300.0000 mg | ORAL_TABLET | ORAL | 1 refills | Status: DC

## 2016-07-18 NOTE — Progress Notes (Signed)
BH MD/PA/NP OP Progress Note  07/18/2016 4:42 PM Brooke Fernandez  MRN:  [id number]  Chief Complaint:  Subjective:  Brooke Fernandez is a 31 year old female with a psychiatric history of anorexia nervosa, and bulimia nervosa, and depression with anxious features who presents today for psychiatric med management follow-up. She was previously followed with Dr. Vanetta ShawlHisada, but has transferred to this writer due to provider changing location.  She reports that she has had substantial benefit from the group therapies as engaged in at this clinic, but that she feels like she is in a good place now with her mood and medications, and will likely take a break from the group therapies for some time.  I spent time learning about her upbringing in [place], and her trajectory in her schooling. 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  She moved to [place] about a year ago, and made this transition without any family nearby. Most of her family remains in [place]. She reports that the transition has gotten much easier as time has gone on, and she has increased her social circle, and gotten back into her regular hobbies such as running, exploring new restaurants and bars on the weekends, and making new friends.  She reports that her depression has been generally well managed with Wellbutrin and the group therapies. She feels that her energy level is good, she is motivated and enjoys her work, she sleeps well at night, and she feels generally helpful for the future. She enjoys spending time with her 2 dogs, going on runs, and spending time with her family when they visit. She is very close with her neighbors.    Spent time discussing her past history of anorexia, and she reports that while she does not restrict herself, she still continues to struggle with  periods of bulimia. She reports that her last episode of vomiting/purging was approximately one month ago, and this was in the context of having not had time to eat during the day given her busy work schedule, and then ending up overeating in the evening, feeling badly about this, and then purging as a result. Spent time discussing some of the strategies and coping skills she has learned over the years, and she reports that she is generally able to avoid purging, but sometimes the habit presents itself. We discussed some modalities of therapy to support her in continuing to use coping strategies, including group therapies, and individual therapy. She is not interested in either of those options at this time, but may explore some eating disorder group therapies in the community.  She has tolerated Wellbutrin 450 mg daily without any significant side effects, dizziness, headache, vertigo. She does not have seizures. She would like to continue at this dose and follow up for med check in a few months. She does not have any suicidal thoughts, or other unsafe thoughts area she reports that she drinks alcohol a few times a week, and she denies any periods of blackout, hangovers, morning drinking, withdrawals.  Visit Diagnosis:    ICD-9-CM ICD-10-CM   1. Moderate episode of recurrent major depressive disorder (HCC) 296.32 F33.1   2. Eating disorder 307.50 F50.9     Past Psychiatric History: See intake H&P for full details. Reviewed, with no updates at this time.   Past Medical History:  Past Medical History:  Diagnosis  Date  . Anxiety   . Depression    History reviewed. No pertinent surgical history.  Family Psychiatric History: See intake H&P for full details. Reviewed, with no updates at this time.   Family History:  Family History  Problem Relation Age of Onset  . CAD Maternal Grandmother   . Colon cancer Maternal Grandfather   . Breast cancer Maternal Aunt   . Depression Mother   . Anxiety  disorder Brother   . Depression Brother     Social History:  Social History   Social History  . Marital status: Single    Spouse name: N/A  . Number of children: N/A  . Years of education: N/A   Social History Main Topics  . Smoking status: Never Smoker  . Smokeless tobacco: Never Used  . Alcohol use 3.0 - 6.0 oz/week    5 Shots of liquor per week  . Drug use: No  . Sexual activity: Yes    Birth control/ protection: Pill   Other Topics Concern  . None   Social History Narrative  . None    Allergies: No Known Allergies  Metabolic Disorder Labs: No results found for: HGBA1C, MPG No results found for: PROLACTIN Lab Results  Component Value Date   CHOL 158 11/14/2015   TRIG 96 11/14/2015   HDL 74 11/14/2015   CHOLHDL 2.1 11/14/2015   VLDL 19 11/14/2015   LDLCALC 65 11/14/2015     Current Medications: Current Outpatient Prescriptions  Medication Sig Dispense Refill  . b complex vitamins tablet Take 1 tablet by mouth daily.    . Biotin (BIOTIN MAXIMUM STRENGTH) 10 MG TABS Take by mouth.    Marland Kitchen buPROPion (WELLBUTRIN XL) 150 MG 24 hr tablet Take total of 450 mg (150 mg + 300 mg) 90 tablet 1  . buPROPion (WELLBUTRIN XL) 300 MG 24 hr tablet Take 1 tablet (300 mg total) by mouth every morning. 90 tablet 1  . Norethindrone-Ethinyl Estradiol-Fe Biphas (LO LOESTRIN FE) 1 MG-10 MCG / 10 MCG tablet Take 1 tablet by mouth daily. 1 Package 11  . Specialty Vitamins Products (ADVANCED COLLAGEN) TABS Take 600 mg by mouth.     No current facility-administered medications for this visit.     Neurologic: Headache: Negative Seizure: Negative Paresthesias: Negative  Musculoskeletal: Strength & Muscle Tone: within normal limits Gait & Station: normal Patient leans: N/A  Psychiatric Specialty Exam: Review of Systems  Constitutional: Negative.   HENT: Negative.   Respiratory: Negative.   Cardiovascular: Negative.   Gastrointestinal: Negative.   Genitourinary: Negative.    Musculoskeletal: Negative.   Neurological: Negative.   Psychiatric/Behavioral: Negative.     Blood pressure 112/68, pulse 80, height 5\' 4"  (1.626 m), weight 59.2 kg (130 lb 9.6 oz).Body mass index is 22.42 kg/m.  General Appearance: Well Groomed  Eye Contact:  Good  Speech:  Clear and Coherent  Volume:  Normal  Mood:  Euthymic  Affect:  Congruent  Thought Process:  Coherent  Orientation:  Full (Time, Place, and Person)  Thought Content: Logical   Suicidal Thoughts:  No  Homicidal Thoughts:  No  Memory:  Immediate;   Good  Judgement:  Good  Insight:  Fair  Psychomotor Activity:  Normal  Concentration:  Concentration: Good and Attention Span: Good  Recall:  NA  Fund of Knowledge: Good  Language: Good  Akathisia:  Negative  Handed:  Right  AIMS (if indicated):  n/a  Assets:  Communication Skills Desire for Improvement Financial Resources/Insurance Housing Leisure  Time Physical Health Resilience Social Support Talents/Skills Transportation Vocational/Educational  ADL's:  Intact  Cognition: WNL  Sleep:  8 hours nightly     Treatment Plan Summary: Mikahla Wisor is a 31 year old female with a psychiatric history of eating disorder, and major depressive disorder, who presents today for med management follow-up. She has been stable on Wellbutrin 450 mg XL daily, and does not report any significant side effects or intolerability at this time. She is engaged well in the outpatient group therapies, and reports significant benefit. She is now at a place where her mood, work, social life are on a positive trajectory, and she wishes to take a little bit of a break from the group therapies. She will follow up with this Clinical research associate for med management, and we will continue to discuss whether or not individual therapy may be needed in the future. While she does not continue with severe eating disorder symptoms, she still continues with periods of purging, with her last episode of vomiting  being 1 month ago. We spent time discussing her thoughts about engaging in eating disorder therapies, and she will consider this at our next visit.  She does not present with any acute safety issues and is appropriate for outpatient follow-up.  # Major Depressive Disorder, Eating Disorder - Continue Wellbutrin 450 mg XL daily; refills sent - Continue to discuss therapy options, both individual and group - Follow-up in 3 months or sooner if needed  Burnard Leigh, MD 07/18/2016, 4:42 PM

## 2016-07-24 ENCOUNTER — Ambulatory Visit (INDEPENDENT_AMBULATORY_CARE_PROVIDER_SITE_OTHER): Payer: 59 | Admitting: Licensed Clinical Social Worker

## 2016-07-24 ENCOUNTER — Ambulatory Visit (HOSPITAL_COMMUNITY): Payer: Self-pay | Admitting: Licensed Clinical Social Worker

## 2016-07-24 DIAGNOSIS — F411 Generalized anxiety disorder: Secondary | ICD-10-CM | POA: Diagnosis not present

## 2016-07-24 DIAGNOSIS — F331 Major depressive disorder, recurrent, moderate: Secondary | ICD-10-CM | POA: Diagnosis not present

## 2016-07-25 ENCOUNTER — Ambulatory Visit (HOSPITAL_COMMUNITY): Payer: 59 | Admitting: Licensed Clinical Social Worker

## 2016-07-25 ENCOUNTER — Encounter (HOSPITAL_COMMUNITY): Payer: Self-pay | Admitting: Licensed Clinical Social Worker

## 2016-07-25 NOTE — Progress Notes (Signed)
Daily Group Progress Note     Program: OP   Group Time: 5:30-6:30  Participation Level: Active  Behavioral Response: Appropriate  Type of Therapy:  Psychoeducation/Therapy  Summary of Progress: Pt participated in a discussion on "putting ourselves first," as part of self-care. Pt agreed that in order to be helpful to others you must be the best you can be, by taking some time out for yourself on occasion. Encouraged pt to be ok with putting herself first . Pt was receptive to the intervention and suggestions.  Raequon Catanzaro S. Annaliah Rivenbark, LCAS 

## 2016-07-31 ENCOUNTER — Ambulatory Visit (HOSPITAL_COMMUNITY): Payer: Self-pay | Admitting: Licensed Clinical Social Worker

## 2016-08-01 ENCOUNTER — Ambulatory Visit (INDEPENDENT_AMBULATORY_CARE_PROVIDER_SITE_OTHER): Payer: 59 | Admitting: Licensed Clinical Social Worker

## 2016-08-01 DIAGNOSIS — F331 Major depressive disorder, recurrent, moderate: Secondary | ICD-10-CM | POA: Diagnosis not present

## 2016-08-02 ENCOUNTER — Encounter (HOSPITAL_COMMUNITY): Payer: Self-pay | Admitting: Licensed Clinical Social Worker
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
## 2016-08-07 ENCOUNTER — Ambulatory Visit (HOSPITAL_COMMUNITY): Payer: Self-pay | Admitting: Licensed Clinical Social Worker

## 2016-08-08 ENCOUNTER — Ambulatory Visit (INDEPENDENT_AMBULATORY_CARE_PROVIDER_SITE_OTHER): Payer: 59 | Admitting: Licensed Clinical Social Worker

## 2016-08-08 DIAGNOSIS — F331 Major depressive disorder, recurrent, moderate: Secondary | ICD-10-CM

## 2016-08-14 ENCOUNTER — Ambulatory Visit (HOSPITAL_COMMUNITY): Payer: Self-pay | Admitting: Licensed Clinical Social Worker

## 2016-08-15 ENCOUNTER — Encounter (HOSPITAL_COMMUNITY): Payer: Self-pay | Admitting: Licensed Clinical Social Worker
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
## 2016-08-16 ENCOUNTER — Ambulatory Visit (INDEPENDENT_AMBULATORY_CARE_PROVIDER_SITE_OTHER): Payer: 59 | Admitting: Licensed Clinical Social Worker

## 2016-08-16 DIAGNOSIS — F331 Major depressive disorder, recurrent, moderate: Secondary | ICD-10-CM | POA: Diagnosis not present

## 2016-08-22 ENCOUNTER — Encounter (HOSPITAL_COMMUNITY): Payer: Self-pay | Admitting: Licensed Clinical Social Worker

## 2016-08-22 ENCOUNTER — Ambulatory Visit (HOSPITAL_COMMUNITY): Payer: Self-pay | Admitting: Licensed Clinical Social Worker

## 2016-08-22 NOTE — Progress Notes (Signed)
   THERAPIST PROGRESS NOTE  Session Time: 4:10-5pm  Participation Level: Active  Behavioral Response: Casual, somewhat depressed  Type of Therapy: individual  Treatment Goals addressed: Coping  Interventions: CBT  Summary: Brooke Fernandez is a 31 y.o. female who presents with no depression today. Pt was tried some suggestions in her mental health recovery, stepping outside the box. She went to [place] town outside [place] by herself and spent the day eating alone, shopping alone, talking to strangers. All this done without anxiety. Congratulated pt on stretching herself.  Also suggested to pt was to buy the book by Dewain Penning "I thought it was just me."  Pt also brought in her self esteem worksheet from her workbook. Processed her "assumptions or rules" that have affected her self-esteem. Pt also went out on a date and she was able to use her mental wellness to help with the anxiety of meeting someone new. Pt has grown in her plan of mental wellness. At this point it was decided that pt should come to therapy every 2 weeks instead of weekly.   Suicidal/Homicidal: No  Therapist Response: Evaluated patients progress. Processed with patient her stressors including self-esteem, relationships, and stretching herself outside the box.  Plan: Return again in 2 weeks and review self esteem workbook and process her new book by Dewain Penning.  Diagnosis: Axis I: Moderate episode of recurrent major depressive disorder        Edman Lipsey S, LCAS 08/16/16

## 2016-09-06 ENCOUNTER — Ambulatory Visit (INDEPENDENT_AMBULATORY_CARE_PROVIDER_SITE_OTHER): Payer: 59 | Admitting: Licensed Clinical Social Worker

## 2016-09-06 DIAGNOSIS — F331 Major depressive disorder, recurrent, moderate: Secondary | ICD-10-CM | POA: Diagnosis not present

## 2016-09-12 ENCOUNTER — Encounter (HOSPITAL_COMMUNITY): Payer: Self-pay | Admitting: Licensed Clinical Social Worker
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
## 2016-09-13 ENCOUNTER — Telehealth (HOSPITAL_COMMUNITY): Payer: Self-pay | Admitting: Licensed Clinical Social Worker

## 2016-09-13 ENCOUNTER — Other Ambulatory Visit (HOSPITAL_COMMUNITY): Payer: Self-pay | Admitting: Psychiatry

## 2016-09-13 ENCOUNTER — Encounter (HOSPITAL_COMMUNITY): Payer: Self-pay | Admitting: Psychiatry

## 2016-09-13 ENCOUNTER — Telehealth (HOSPITAL_COMMUNITY): Payer: Self-pay | Admitting: Psychiatry

## 2016-09-13 DIAGNOSIS — F411 Generalized anxiety disorder: Secondary | ICD-10-CM

## 2016-09-13 DIAGNOSIS — F331 Major depressive disorder, recurrent, moderate: Secondary | ICD-10-CM

## 2016-09-13 MED ORDER — HYDROXYZINE PAMOATE 25 MG PO CAPS: ORAL_CAPSULE | ORAL | 1 refills | Status: DC

## 2016-09-13 NOTE — Telephone Encounter (Signed)
Patient with increased anxiety and some panic at work in the face of job stressors.  I left VM to get rescheduled sooner and consider some additional prn medications for anxiety. Patient encouraged to send message via mychart or call to discuss, or call to come in sooner.

## 2016-09-13 NOTE — Telephone Encounter (Signed)
Pt called wanting an earlier appt with Dr. Gaspar Skeeters. Called pt back and left vm of a cancellation for Monday 4/30 at 10:30.

## 2016-09-17 ENCOUNTER — Ambulatory Visit (HOSPITAL_COMMUNITY): Payer: Self-pay | Admitting: Psychiatry

## 2016-09-25 ENCOUNTER — Encounter: Payer: Self-pay | Admitting: Family Medicine

## 2016-09-27 ENCOUNTER — Other Ambulatory Visit: Payer: Self-pay | Admitting: *Deleted

## 2016-09-27 MED ORDER — NORETHIN-ETH ESTRAD-FE BIPHAS 1 MG-10 MCG / 10 MCG PO TABS: 1.0000 | ORAL_TABLET | Freq: Every day | ORAL | 2 refills | Status: DC

## 2016-10-01 ENCOUNTER — Ambulatory Visit (INDEPENDENT_AMBULATORY_CARE_PROVIDER_SITE_OTHER): Payer: 59 | Admitting: Licensed Clinical Social Worker

## 2016-10-01 DIAGNOSIS — F331 Major depressive disorder, recurrent, moderate: Secondary | ICD-10-CM | POA: Diagnosis not present

## 2016-10-08 ENCOUNTER — Encounter (HOSPITAL_COMMUNITY): Payer: Self-pay | Admitting: Licensed Clinical Social Worker

## 2016-10-08 NOTE — Progress Notes (Signed)
   THERAPIST PROGRESS NOTE  Session Time: 4:10-5pm  Participation Level: Active  Behavioral Response: Casual, Euthymic,   Type of Therapy: individual  Treatment Goals addressed: Coping  Interventions: CBT  Summary: Brooke Fernandez is a 31 y.o. female who presents for her individual session. Pt discussed her psychiatric symptoms and current life events. Pt thinks her current medication of hydroxozine is working for her anxiety and wellbutrin for her depression with no negative side effects. Her depression and anxiety symptoms have minimized. Pt has worked on building her self-confidence. She has been actively challenging herself by going out alone to coffee shops and restaurants. 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 She has met new people and was confident in who she is. Pt is feeling more confident at work as feels that "Im a valuable member of my team."  Suggested to pt to purchase Almond Lint, "I thought it was just me." as pt wants to work on her shame from childhood. Taught pt new grounding techniques. First explained the purpose, process and practice of mindfulness techniques. PT  Was receptive to the mindfulness practice.       Suicidal/Homicidal: No  Therapist Response: Assessed pt's current functioning. And Evaluated patients progress. Processed with patient her stressors including vulnerability, relationships, and mindfulness practice.  Plan: Return again in 2 weeks to process abandonment and attachment issues.  Diagnosis: Axis I: Moderate episode of recurrent major depressive disorder        Jennfer Gassen S, LCAS 10/01/16

## 2016-10-16 ENCOUNTER — Ambulatory Visit (INDEPENDENT_AMBULATORY_CARE_PROVIDER_SITE_OTHER): Payer: 59 | Admitting: Psychiatry

## 2016-10-16 ENCOUNTER — Encounter (HOSPITAL_COMMUNITY): Payer: Self-pay | Admitting: Psychiatry

## 2016-10-16 VITALS — BP 110/68 | HR 76 | Ht 64.0 in | Wt 127.0 lb

## 2016-10-16 DIAGNOSIS — F411 Generalized anxiety disorder: Secondary | ICD-10-CM

## 2016-10-16 DIAGNOSIS — F331 Major depressive disorder, recurrent, moderate: Secondary | ICD-10-CM

## 2016-10-16 DIAGNOSIS — F1099 Alcohol use, unspecified with unspecified alcohol-induced disorder: Secondary | ICD-10-CM | POA: Diagnosis not present

## 2016-10-16 DIAGNOSIS — F509 Eating disorder, unspecified: Secondary | ICD-10-CM

## 2016-10-16 DIAGNOSIS — Z818 Family history of other mental and behavioral disorders: Secondary | ICD-10-CM | POA: Diagnosis not present

## 2016-10-16 MED ORDER — BUPROPION HCL ER (XL) 300 MG PO TB24: 300.0000 mg | ORAL_TABLET | ORAL | 1 refills | Status: DC

## 2016-10-16 MED ORDER — BUPROPION HCL ER (XL) 150 MG PO TB24: ORAL_TABLET | ORAL | 1 refills | Status: DC

## 2016-10-16 NOTE — Progress Notes (Signed)
BH MD/PA/NP OP Progress Note  10/16/2016 4:14 PM Brooke Fernandez  MRN:  [id number]  Chief Complaint:  Chief Complaint    Follow-up     Subjective:  Brooke Fernandez presents today for psychiatric follow-up. She reports that she continues to eat well, she has been practicing a keto-diet, counting calories (per baseline), but is not engaging in any purging behaviors or vomiting behaviors.  She reports that her mood is good with Wellbutrin 450 mg, and reports that she sleeps well at night.  She has used Vistaril on several occasions for sleep and anxiety, and it has performed well.  Spent time discussing her most pressing issue, which is that she feels lonely. She reports that she has friends, and enjoys hanging out with them, but she feels a lack of companionship, particularly in her romantic life. We spent time troubleshooting, considering how she might be able to step out of her comfort zone in joining jogging groups, going to comedy clubs, joining into hobbies and activities that might introduce her to other singles.  Spent time hearing about some of her self chastising, particularly she wonders if she is too picky. I listen to her kind of work through some of her criteria and hopes for a romantic partner, and it frankly all seemed quite reasonable.  Spent time discussing the reality that this community of [place] is fairly "married" and it may be more challenging to find singles in the area, but certainly is doable overtime.  Visit Diagnosis:    ICD-9-CM ICD-10-CM   1. Moderate episode of recurrent major depressive disorder (HCC) 296.32 F33.1 buPROPion (WELLBUTRIN XL) 300 MG 24 hr tablet     buPROPion (WELLBUTRIN XL) 150 MG 24 hr tablet  2. Generalized anxiety disorder 300.02 F41.1 buPROPion (WELLBUTRIN XL) 300 MG 24 hr tablet     buPROPion (WELLBUTRIN XL) 150 MG 24 hr tablet  3. Eating disorder 307.50 F50.9 buPROPion (WELLBUTRIN XL) 300 MG 24 hr tablet     buPROPion (WELLBUTRIN XL) 150 MG 24 hr  tablet    Past Psychiatric History: See intake H&P for full details. Reviewed, with no updates at this time.   Past Medical History:  Past Medical History:  Diagnosis Date  . Anxiety   . Depression    History reviewed. No pertinent surgical history.  Family Psychiatric History: See intake H&P for full details. Reviewed, with no updates at this time.   Family History:  Family History  Problem Relation Age of Onset  . CAD Maternal Grandmother   . Colon cancer Maternal Grandfather   . Breast cancer Maternal Aunt   . Depression Mother   . Anxiety disorder Brother   . Depression Brother     Social History:  Social History   Social History  . Marital status: Single    Spouse name: N/A  . Number of children: N/A  . Years of education: N/A   Social History Main Topics  . Smoking status: Never Smoker  . Smokeless tobacco: Never Used  . Alcohol use 6.6 oz/week    5 Shots of liquor, 3 Cans of beer, 3 Glasses of wine per week  . Drug use: No  . Sexual activity: Not Currently    Birth control/ protection: Pill   Other Topics Concern  . None   Social History Narrative  . None    Allergies: No Known Allergies  Metabolic Disorder Labs: No results found for: HGBA1C, MPG No results found for: PROLACTIN Lab Results  Component Value Date  CHOL 158 11/14/2015   TRIG 96 11/14/2015   HDL 74 11/14/2015   CHOLHDL 2.1 11/14/2015   VLDL 19 11/14/2015   LDLCALC 65 11/14/2015     Current Medications: Current Outpatient Prescriptions  Medication Sig Dispense Refill  . b complex vitamins tablet Take 1 tablet by mouth daily.    . Biotin (BIOTIN MAXIMUM STRENGTH) 10 MG TABS Take by mouth.    Marland Kitchen. buPROPion (WELLBUTRIN XL) 150 MG 24 hr tablet Take total of 450 mg (150 mg + 300 mg) 90 tablet 1  . buPROPion (WELLBUTRIN XL) 300 MG 24 hr tablet Take 1 tablet (300 mg total) by mouth every morning. 90 tablet 1  . Norethindrone-Ethinyl Estradiol-Fe Biphas (LO LOESTRIN FE) 1 MG-10 MCG /  10 MCG tablet Take 1 tablet by mouth daily. 1 Package 2  . Specialty Vitamins Products (ADVANCED COLLAGEN) TABS Take 600 mg by mouth.    . hydrOXYzine (VISTARIL) 25 MG capsule Take 1-2 capsules daily for anxiety or panic (Patient not taking: Reported on 10/16/2016) 60 capsule 1   No current facility-administered medications for this visit.     Neurologic: Headache: Negative Seizure: Negative Paresthesias: Negative  Musculoskeletal: Strength & Muscle Tone: within normal limits Gait & Station: normal Patient leans: N/A  Psychiatric Specialty Exam: Review of Systems  Constitutional: Negative.   HENT: Negative.   Respiratory: Negative.   Cardiovascular: Negative.   Gastrointestinal: Negative.   Genitourinary: Negative.   Musculoskeletal: Negative.   Neurological: Negative.   Psychiatric/Behavioral: Negative.     Blood pressure 110/68, pulse 76, height 5\' 4"  (1.626 m), weight 57.6 kg (127 lb), SpO2 97 %.Body mass index is 21.8 kg/m.  General Appearance: Well Groomed  Eye Contact:  Good  Speech:  Clear and Coherent  Volume:  Normal  Mood:  Euthymic  Affect:  Congruent  Thought Process:  Coherent  Orientation:  Full (Time, Place, and Person)  Thought Content: Logical   Suicidal Thoughts:  No  Homicidal Thoughts:  No  Memory:  Immediate;   Good  Judgement:  Good  Insight:  Fair  Psychomotor Activity:  Normal  Concentration:  Concentration: Good and Attention Span: Good  Recall:  NA  Fund of Knowledge: Good  Language: Good  Akathisia:  Negative  Handed:  Right  AIMS (if indicated):  n/a  Assets:  Communication Skills Desire for Improvement Financial Resources/Insurance Housing Leisure Time Physical Health Resilience Social Support Talents/Skills Transportation Vocational/Educational  ADL's:  Intact  Cognition: WNL  Sleep:  8 hours nightly   Treatment Plan Summary: Brooke Fernandez is a 31 year old female with a psychiatric history of eating disorder, and major  depressive disorder, who presents today for med management follow-up. She has been stable on Wellbutrin 450 mg XL daily. She continues in therapy. Spent time today discussing some ways she can increase her social circle, and potentially make new relationships with single young people.    # Major Depressive Disorder, Eating Disorder - Continue Wellbutrin 450 mg XL daily; refills sent - Vistaril 25 mg when necessary for anxiety or sleep difficulty - Follow-up in 3 months or sooner if needed  Burnard LeighAlexander Arya Eksir, MD 10/16/2016, 4:14 PM

## 2016-10-17 ENCOUNTER — Ambulatory Visit (INDEPENDENT_AMBULATORY_CARE_PROVIDER_SITE_OTHER): Payer: 59 | Admitting: Licensed Clinical Social Worker

## 2016-10-17 ENCOUNTER — Encounter (HOSPITAL_COMMUNITY): Payer: Self-pay | Admitting: Licensed Clinical Social Worker

## 2016-10-17 DIAGNOSIS — F331 Major depressive disorder, recurrent, moderate: Secondary | ICD-10-CM | POA: Diagnosis not present

## 2016-10-17 NOTE — Progress Notes (Signed)
   THERAPIST PROGRESS NOTE  Session Time: 4:10-5pm  Participation Level: Active  Behavioral Response: Casual, Euthymic,   Type of Therapy: individual  Treatment Goals addressed: Coping  Interventions: CBT  Summary: Brooke Fernandez is a 31 y.o. female who presents for her individual session. Pt discussed her psychiatric symptoms and current life events. Pt just met with Dr. Sharyon Medicus, psychiatrist. She is not taking her hydroxyzine very often because she doesn't feel like its' working. Pt is on the [place] and feels that it has improved her mood, she is experiencing no panic attacks, sleeps well, eats regularly and is not purging or bingeing. "I like feeling this calm." Pt is still experiencing social anxiety. Gave pt workbook on social anxiety and CBT. Discussed basic CBT concepts and the thought emotion connection and alternative perspectives. Gave pt a handout on self-compassion and childhood shame and reviewed it with pt. This is also homework for pt. Pt bought "I thought it was just me" [employer]mond [employer]. Asked open-ended questions about the book and the relevance to pt. Pt was open to suggestions during the intervention.          Suicidal/Homicidal: No  Therapist Response: Assessed pt's current functioning. And Evaluated patients current functioning and reviewed progress. Assisted pt processing social anxiety, shame, self compassion, CBT, Keto diet, [employer] book. Assisted pt processing for the management of her stressors.  Plan: Return again in 2 weeks to process social anxiety, self-compassion and childhood shame workbook/worksheets.  Diagnosis: Axis I: Moderate episode of recurrent major depressive disorder        Chea Malan S, LCAS 10/17/16

## 2016-10-31 ENCOUNTER — Ambulatory Visit (HOSPITAL_COMMUNITY): Payer: Self-pay | Admitting: Licensed Clinical Social Worker

## 2016-11-14 ENCOUNTER — Ambulatory Visit (INDEPENDENT_AMBULATORY_CARE_PROVIDER_SITE_OTHER): Payer: 59 | Admitting: Licensed Clinical Social Worker

## 2016-11-14 DIAGNOSIS — F331 Major depressive disorder, recurrent, moderate: Secondary | ICD-10-CM

## 2016-11-19 ENCOUNTER — Encounter: Payer: Self-pay | Admitting: Family Medicine

## 2016-11-19 ENCOUNTER — Ambulatory Visit (INDEPENDENT_AMBULATORY_CARE_PROVIDER_SITE_OTHER): Payer: 59 | Admitting: Family Medicine

## 2016-11-19 VITALS — BP 111/68 | HR 81 | Ht 64.0 in | Wt 125.1 lb

## 2016-11-19 DIAGNOSIS — Z01419 Encounter for gynecological examination (general) (routine) without abnormal findings: Secondary | ICD-10-CM

## 2016-11-19 DIAGNOSIS — F331 Major depressive disorder, recurrent, moderate: Secondary | ICD-10-CM

## 2016-11-19 MED ORDER — NORETHIN-ETH ESTRAD-FE BIPHAS 1 MG-10 MCG / 10 MCG PO TABS: 1.0000 | ORAL_TABLET | Freq: Every day | ORAL | 3 refills | Status: DC

## 2016-11-19 NOTE — Progress Notes (Signed)
Elevated score on depression screen.Stated that she is currently seeing someone outside of our office.

## 2016-11-19 NOTE — Progress Notes (Signed)
GYNECOLOGY ANNUAL PREVENTATIVE CARE ENCOUNTER NOTE  Subjective:   Brooke Fernandez is a 31 y.o. No obstetric history on file. female here for a routine annual gynecologic exam.  Current complaints: none.   Denies abnormal vaginal bleeding, discharge, pelvic pain, problems with intercourse or other gynecologic concerns.    Gynecologic History Patient's last menstrual period was 10/28/2016 (exact date). Patient is not sexually active  Contraception: OCP (estrogen/progesterone) Last Pap: 10/2015. Results were: normal Last mammogram: n/a.   Obstetric History OB History  No data available    Past Medical History:  Diagnosis Date  . Anxiety   . Depression     No past surgical history on file.  Current Outpatient Prescriptions on File Prior to Visit  Medication Sig Dispense Refill  . b complex vitamins tablet Take 1 tablet by mouth daily.    . Biotin (BIOTIN MAXIMUM STRENGTH) 10 MG TABS Take by mouth.    Marland Kitchen buPROPion (WELLBUTRIN XL) 150 MG 24 hr tablet Take total of 450 mg (150 mg + 300 mg) 90 tablet 1  . buPROPion (WELLBUTRIN XL) 300 MG 24 hr tablet Take 1 tablet (300 mg total) by mouth every morning. 90 tablet 1  . Specialty Vitamins Products (ADVANCED COLLAGEN) TABS Take 600 mg by mouth.     No current facility-administered medications on file prior to visit.     No Known Allergies  Social History   Social History  . Marital status: Single    Spouse name: N/A  . Number of children: N/A  . Years of education: N/A   Occupational History  . Not on file.   Social History Main Topics  . Smoking status: Never Smoker  . Smokeless tobacco: Never Used  . Alcohol use 6.6 oz/week    5 Shots of liquor, 3 Cans of beer, 3 Glasses of wine per week  . Drug use: No  . Sexual activity: Not Currently    Birth control/ protection: Pill   Other Topics Concern  . Not on file   Social History Narrative  . No narrative on file    Family History  Problem Relation Age of Onset  .  CAD Maternal Grandmother   . Colon cancer Maternal Grandfather   . Breast cancer Maternal Aunt   . Depression Mother   . Anxiety disorder Brother   . Depression Brother     The following portions of the patient's history were reviewed and updated as appropriate: allergies, current medications, past family history, past medical history, past social history, past surgical history and problem list.  Review of Systems Pertinent items are noted in HPI.   Objective:  BP 111/68   Pulse 81   Ht 5\' 4"  (1.626 m)   Wt 125 lb 1.6 oz (56.7 kg)   LMP 10/28/2016 (Exact Date)   BMI 21.47 kg/m  CONSTITUTIONAL: Well-developed, well-nourished female in no acute distress.  HENT:  Normocephalic, atraumatic, External right and left ear normal. Oropharynx is clear and moist EYES: Conjunctivae and EOM are normal. Pupils are equal, round, and reactive to light. No scleral icterus.  NECK: Normal range of motion, supple, no masses.  Normal thyroid.   CARDIOVASCULAR: Normal heart rate noted, regular rhythm RESPIRATORY: Clear to auscultation bilaterally. Effort and breath sounds normal, no problems with respiration noted. BREASTS: Symmetric in size. No masses, skin changes, nipple drainage, or lymphadenopathy. ABDOMEN: Soft, normal bowel sounds, no distention noted.  No tenderness, rebound or guarding.  PELVIC: Normal appearing external genitalia; normal appearing vaginal  mucosa and cervix.  No abnormal discharge noted.  Pap smear obtained.  Normal uterine size, no other palpable masses, no uterine or adnexal tenderness. MUSCULOSKELETAL: Normal range of motion. No tenderness.  No cyanosis, clubbing, or edema.  2+ distal pulses. SKIN: Skin is warm and dry. No rash noted. Not diaphoretic. No erythema. No pallor. NEUROLOGIC: Alert and oriented to person, place, and time. Normal reflexes, muscle tone coordination. No cranial nerve deficit noted. PSYCHIATRIC: Normal mood and affect. Normal behavior. Normal judgment  and thought content.  Assessment:  Annual gynecologic examination with pap smear   Plan:  STD testing discussed. Patient declined testing Discussed annual labs - pt declines. Discussed diet - on ketogenic diet Currently taking welbutrin for depression/anxiety - seeing outside provider   Routine preventative health maintenance measures emphasized. Please refer to After Visit Summary for other counseling recommendations.    Brooke CelesteJacob Rome Schlauch, DO [facility]

## 2016-11-19 NOTE — Patient Instructions (Signed)

## 2016-11-26 ENCOUNTER — Encounter (HOSPITAL_COMMUNITY): Payer: Self-pay | Admitting: Licensed Clinical Social Worker

## 2016-11-26 NOTE — Progress Notes (Signed)
   THERAPIST PROGRESS NOTE  Session Time: 4:10-5pm  Participation Level: Active  Behavioral Response: Casual, Euthymic,   Type of Therapy: individual  Treatment Goals addressed: Coping  Interventions: CBT  Summary: Brooke PoagLauren Fernandez is a 31 y.o. female who presents for her individual session. Pt discussed her psychiatric symptoms and current life events.Pt is reading "How to be yourself." and discussed it at length, asking open ended questions about the prevalence to the book. Her social anxiety has been stressor for her and this book has offerred many suggestions. Pt has begun dating again and reports, "it's the first time I can sit and handle my own thoughts." She reports she is burned out at work. Pt is not focusing on her self care and has stopped the Keto diet, is not exercising and is emotionally eating. Pt reports she is not bingeing or purging. Discussed basic CBT concepts and the thought emotion connection and alternative perspectives. Pt was open during the intervention.          Suicidal/Homicidal: No  Therapist Response: Assessed pt's current functioning. And Evaluated patients current functioning and reviewed progress. Assisted pt processing social anxiety, CBT, self care. Assisted pt processing for the management of her stressors.  Plan: Return again in 4 weeks.  Diagnosis: Axis I: Moderate episode of recurrent major depressive disorder        MACKENZIE,LISBETH S, LCAS 10/17/16

## 2016-11-28 ENCOUNTER — Ambulatory Visit (INDEPENDENT_AMBULATORY_CARE_PROVIDER_SITE_OTHER): Payer: 59 | Admitting: Licensed Clinical Social Worker

## 2016-11-28 DIAGNOSIS — F331 Major depressive disorder, recurrent, moderate: Secondary | ICD-10-CM | POA: Diagnosis not present

## 2016-11-29 ENCOUNTER — Encounter (HOSPITAL_COMMUNITY): Payer: Self-pay | Admitting: Licensed Clinical Social Worker

## 2016-11-29 NOTE — Progress Notes (Signed)
   THERAPIST PROGRESS NOTE  Session Time: 4:10-5pm  Participation Level: Active  Behavioral Response: Casual, Depressed  Type of Therapy: individual  Treatment Goals addressed: Coping  Interventions: CBT  Summary: Brooke Fernandez is a 31 y.o. female who presents for her individual session. Pt discussed her psychiatric symptoms and current life events. Pt began crying as soon as she sat down. She shared her depression has gotten worse. She is isolating, bored, angry, lonely, sad, restless. She is not eating well, is not purging and is still on the Keto diet even though she says at first it made her feel better but now it has plateau. Discussed basic CBT concepts and the thought emotion connection with alternative perspectives. Pt was open to the CBT concepts which assisted her in different perspectives. Pt is mostly unhappy at work. She is burned out at work, when not at work she stresses about work and after she leaves work she is stressed. Discussed employment alternatives. She has an upcoming trip with her father which she is not looking forward to. Processed her feelings about the trip and maintaining a positive attitude while gone. She went home to [place] but it did not go great but she spent some quality time with her mother. Pt appears lost in her next steps and can't work through issues because her depression has increased. Will speak with Dr. Gaspar SkeetersEskir about her medication at pt's request. Discussed the possibility of pt returning to OP group 1x per week.          Suicidal/Homicidal: No  Therapist Response: Assessed pt's current functioning. And Evaluated patients current functioning and reviewed progress. Assisted pt processing social anxiety, CBT, self care. Assisted pt processing for the management of her stressors.  Plan: Return again in 4 weeks. Talk with Dr. Gaspar SkeetersEskir about medication and call pt. Suggested pt return to OP group.  Diagnosis: Axis I: Moderate episode of recurrent  major depressive disorder        MACKENZIE,LISBETH S, LCAS 7/1//18

## 2016-12-03 ENCOUNTER — Encounter (HOSPITAL_COMMUNITY): Payer: Self-pay | Admitting: Psychiatry

## 2016-12-04 ENCOUNTER — Ambulatory Visit (INDEPENDENT_AMBULATORY_CARE_PROVIDER_SITE_OTHER): Payer: 59 | Admitting: Licensed Clinical Social Worker

## 2016-12-04 DIAGNOSIS — F331 Major depressive disorder, recurrent, moderate: Secondary | ICD-10-CM

## 2016-12-05 ENCOUNTER — Encounter (HOSPITAL_COMMUNITY): Payer: Self-pay | Admitting: Licensed Clinical Social Worker

## 2016-12-05 NOTE — Progress Notes (Signed)
Daily Group Progress Note     Program: OP   Group Time: 5:30-6:30  Participation Level: Active  Behavioral Response: Appropriate  Type of Therapy:  Psychoeducation/Therapy  Summary of Progress: Today was pt's first day in OP group. She introduced herself and shared why she was seeking therapy. Pt participated in a discussion on "The Happiness Advantage." Shawn Achor developed an Guide to Happiness." Pt shared her definition of happiness. Pt was encouraged to continue working in the workbook on finding happiness.  Vernona RiegerLisbeth S. Mitchelle Goerner, LCAS

## 2016-12-12 DIAGNOSIS — R21 Rash and other nonspecific skin eruption: Secondary | ICD-10-CM | POA: Diagnosis not present

## 2016-12-24 ENCOUNTER — Ambulatory Visit (INDEPENDENT_AMBULATORY_CARE_PROVIDER_SITE_OTHER): Payer: 59 | Admitting: Licensed Clinical Social Worker

## 2016-12-24 DIAGNOSIS — F331 Major depressive disorder, recurrent, moderate: Secondary | ICD-10-CM | POA: Diagnosis not present

## 2016-12-25 ENCOUNTER — Other Ambulatory Visit (HOSPITAL_COMMUNITY): Payer: Self-pay | Admitting: Psychiatry

## 2016-12-25 ENCOUNTER — Encounter (HOSPITAL_COMMUNITY): Payer: Self-pay | Admitting: Psychiatry

## 2016-12-25 DIAGNOSIS — F331 Major depressive disorder, recurrent, moderate: Secondary | ICD-10-CM

## 2016-12-25 DIAGNOSIS — F411 Generalized anxiety disorder: Secondary | ICD-10-CM

## 2016-12-25 MED ORDER — VORTIOXETINE HBR 5 MG PO TABS: 5.0000 mg | ORAL_TABLET | Freq: Every day | ORAL | 0 refills | Status: DC

## 2016-12-27 ENCOUNTER — Encounter (HOSPITAL_COMMUNITY): Payer: Self-pay | Admitting: Licensed Clinical Social Worker

## 2016-12-27 NOTE — Progress Notes (Signed)
   THERAPIST PROGRESS NOTE  Session Time: 4:10-5pm  Participation Level: Active  Behavioral Response: Casual, Depressed  Type of Therapy: individual  Treatment Goals addressed: Coping  Interventions: CBT  Summary: Brooke Fernandez is a 31 y.o. female who presents for her individual session. Pt discussed her psychiatric symptoms and current life events. Pt has decided she does not want to continue with the OP group but does want to continue with individual sessions. Asked pt open ended questions about her decisions. Discussed basic CBT concepts and the thought emotion connection with alternative perspectives. Pt was open to the CBT concepts which assisted her in different perspectives. Pt is still  unhappy at work. She is burned out at work, when not at work she stresses about work and after she leaves work she is stressed. Pt brought her contract with her and reviewed it with pt. Suggested she take her contract to HR for further explanations. Pt and clinician discussed her healthy coping skills with suggestions to continue using what works for her. Pt was in agreement with suggestions.         Suicidal/Homicidal: No  Therapist Response: Assessed pt's current functioning. And Evaluated patients current functioning and reviewed progress. Assisted pt processing work, CBT, self care. Assisted pt processing for the management of her stressors.  Plan: Return again in 4 weeks.  Diagnosis: Axis I: Moderate episode of recurrent major depressive disorder        Siddhartha Hoback S, LCAS 12/24/16

## 2016-12-28 ENCOUNTER — Telehealth (HOSPITAL_COMMUNITY): Payer: Self-pay | Admitting: *Deleted

## 2016-12-28 NOTE — Telephone Encounter (Signed)
Received prior authorization for Trintellix. Insurance ID not found on fax, called card number listed with [employer] but was told no patient found with active insurance.Called pharmacy for insurance ID and phone number [phone]. Called number spoke with Marchelle Folksmanda who states that decision will be faxed in 24-72 hours. PA #[id number].

## 2017-01-07 ENCOUNTER — Ambulatory Visit (HOSPITAL_COMMUNITY): Payer: Self-pay | Admitting: Licensed Clinical Social Worker

## 2017-01-14 ENCOUNTER — Encounter (HOSPITAL_COMMUNITY): Payer: Self-pay | Admitting: Licensed Clinical Social Worker

## 2017-01-14 ENCOUNTER — Ambulatory Visit (INDEPENDENT_AMBULATORY_CARE_PROVIDER_SITE_OTHER): Payer: 59 | Admitting: Licensed Clinical Social Worker

## 2017-01-14 DIAGNOSIS — F331 Major depressive disorder, recurrent, moderate: Secondary | ICD-10-CM

## 2017-01-14 DIAGNOSIS — F411 Generalized anxiety disorder: Secondary | ICD-10-CM

## 2017-01-14 NOTE — Progress Notes (Signed)
   THERAPIST PROGRESS NOTE  Session Time: 4:10-5pm  Participation Level: Active  Behavioral Response: Casual, Depressed  Type of Therapy: individual  Treatment Goals addressed: Coping  Interventions: CBT   Summary: Pt presented for her individual counseling session. Pt discussed her psychiatric symptoms and current life events. Pt has found a house to rent and is very excited to move. Her parents are coming this weekend to help her move. Pt reports she feels like her new medication Trintellex is working well. She has more energy, her moods are stable and she has no fear going to work. Suggested to pt to begin monitoring her moods. Gave pt a worksheet for monitoring. Gave pt a self compassion booklet and reviewed booklet with pt. Gave pt breathing and calming techniques handout. Pt shares she is using her mindfulness techniques as coping tools. Pt was open to suggestions.   Suicidal/Homicidal: No  Therapist Response: Assessed pt's current functioning. And Evaluated patients current functioning and reviewed progress. Assisted pt processing moods, self compassion, mindfulness techniques for the management of her stressors.  Plan: Return again in 4 weeks.  Diagnosis: Axis I: Moderate episode of recurrent major depressive disorder        Shawne Bulow S, LCAS 01/16/17

## 2017-01-16 ENCOUNTER — Ambulatory Visit (INDEPENDENT_AMBULATORY_CARE_PROVIDER_SITE_OTHER): Payer: 59 | Admitting: Psychiatry

## 2017-01-16 DIAGNOSIS — F411 Generalized anxiety disorder: Secondary | ICD-10-CM | POA: Diagnosis not present

## 2017-01-16 DIAGNOSIS — F331 Major depressive disorder, recurrent, moderate: Secondary | ICD-10-CM | POA: Diagnosis not present

## 2017-01-16 DIAGNOSIS — Z8659 Personal history of other mental and behavioral disorders: Secondary | ICD-10-CM

## 2017-01-16 MED ORDER — VORTIOXETINE HBR 5 MG PO TABS: 5.0000 mg | ORAL_TABLET | Freq: Every day | ORAL | 0 refills | Status: DC

## 2017-01-16 NOTE — Progress Notes (Signed)
BH MD/PA/NP OP Progress Note  01/16/2017 4:07 PM Brooke Fernandez  MRN:  [id number]  Chief Complaint: med check  HPI: Brooke Fernandez reports that she is doing well with the Trintellix.  Reports that her mood is significantly improved. She denies any significant side effects and reports that she continues on the 5 mg daily. She has lowered the Wellbutrin to 300 mg daily as discussed. She is moving this weekend into a new rental home, which has a yard for her dogs. She is very excited about the change of scenery and to be in a new neighborhood that is more [place]. She reports things at work are going well. She is taking a break from dating at this time, and was a bit negative in discussing her perception of dating and management at the moment. I spent time encouraging her to remain fairly open, pointing out that she oscillates between these patterns of strong investments in dating, and then withdrawal, and perhaps there is a happy medium. She was receptive to this, continues to work in individual therapy. Agrees to follow-up in 3 months.  Visit Diagnosis:    ICD-10-CM   1. Moderate episode of recurrent major depressive disorder (HCC) F33.1 vortioxetine HBr (TRINTELLIX) 5 MG TABS  2. Generalized anxiety disorder F41.1 vortioxetine HBr (TRINTELLIX) 5 MG TABS    Past Psychiatric History: See intake H&P for full details. Reviewed, with no updates at this time.  Past Medical History:  Past Medical History:  Diagnosis Date  . Anxiety   . Depression    No past surgical history on file.  Family Psychiatric History: See intake H&P for full details. Reviewed, with no updates at this time.  Family History:  Family History  Problem Relation Age of Onset  . CAD Maternal Grandmother   . Colon cancer Maternal Grandfather   . Breast cancer Maternal Aunt   . Depression Mother   . Anxiety disorder Brother   . Depression Brother     Social History:  Social History   Social History  . Marital status:  Single    Spouse name: N/A  . Number of children: N/A  . Years of education: N/A   Social History Main Topics  . Smoking status: Never Smoker  . Smokeless tobacco: Never Used  . Alcohol use 6.6 oz/week    5 Shots of liquor, 3 Cans of beer, 3 Glasses of wine per week  . Drug use: No  . Sexual activity: Not Currently    Birth control/ protection: Pill   Other Topics Concern  . Not on file   Social History Narrative  . No narrative on file    Allergies: No Known Allergies  Metabolic Disorder Labs: No results found for: HGBA1C, MPG No results found for: PROLACTIN Lab Results  Component Value Date   CHOL 158 11/14/2015   TRIG 96 11/14/2015   HDL 74 11/14/2015   CHOLHDL 2.1 11/14/2015   VLDL 19 11/14/2015   LDLCALC 65 11/14/2015   No results found for: TSH  Therapeutic Level Labs: No results found for: LITHIUM No results found for: VALPROATE No components found for:  CBMZ  Current Medications: Current Outpatient Prescriptions  Medication Sig Dispense Refill  . b complex vitamins tablet Take 1 tablet by mouth daily.    . Biotin (BIOTIN MAXIMUM STRENGTH) 10 MG TABS Take by mouth.    Marland Kitchen. buPROPion (WELLBUTRIN XL) 300 MG 24 hr tablet Take 1 tablet (300 mg total) by mouth every morning. 90 tablet 1  .  Norethindrone-Ethinyl Estradiol-Fe Biphas (LO LOESTRIN FE) 1 MG-10 MCG / 10 MCG tablet Take 1 tablet by mouth daily. 3 Package 3  . Specialty Vitamins Products (ADVANCED COLLAGEN) TABS Take 600 mg by mouth.    . vortioxetine HBr (TRINTELLIX) 5 MG TABS Take 1 tablet (5 mg total) by mouth daily. 90 tablet 0   No current facility-administered medications for this visit.      Musculoskeletal: Strength & Muscle Tone: within normal limits Gait & Station: normal Patient leans: N/A  Psychiatric Specialty Exam: ROS  There were no vitals taken for this visit.There is no height or weight on file to calculate BMI.  General Appearance: Casual and Fairly Groomed  Eye Contact:  Good   Speech:  Clear and Coherent  Volume:  Normal  Mood:  Dysphoric and Euthymic  Affect:  Appropriate and Congruent  Thought Process:  Coherent and Goal Directed  Orientation:  Full (Time, Place, and Person)  Thought Content: Logical   Suicidal Thoughts:  No  Homicidal Thoughts:  No  Memory:  Immediate;   Good  Judgement:  Good  Insight:  Good  Psychomotor Activity:  Normal  Concentration:  Concentration: Good  Recall:  Good  Fund of Knowledge: Good  Language: Good  Akathisia:  Negative  Handed:  Right  AIMS (if indicated): not done  Assets:  Communication Skills Desire for Improvement Financial Resources/Insurance Housing Leisure Time Physical Health Resilience Social Support Talents/Skills Transportation Vocational/Educational  ADL's:  Intact  Cognition: WNL  Sleep:  Good   Screenings: GAD-7     [facility]  Total GAD-7 Score  10    PHQ2-9     [facility] 11/19/[facility]  PHQ-2 Total Score  1  PHQ-9 Total Score  3      Assessment and Plan: Jeniffer [age] year old female with history of MDD, GAD and eating disorder in remission.  She works as an Barista.  She presents today for medication management follow-up and has been doing better on a combination of Wellbutrin plus Trintellix. No acute safety issues at this time and will follow up in 3 months.  1. Moderate episode of recurrent major depressive disorder (HCC)   2. Generalized anxiety disorder    Continue Wellbutrin 300 mg XL daily Continue Trintellix 5 mg daily; okay to increase to 10 mg as tolerated RTC 3 months  Burnard Leigh, MD 01/16/2017, 4:07 PM

## 2017-01-21 ENCOUNTER — Ambulatory Visit (HOSPITAL_COMMUNITY)
Admission: EM | Admit: 2017-01-21 | Discharge: 2017-01-21 | Disposition: A | Discharge status: Home / Self Care | Payer: 59 | Attending: Internal Medicine | Admitting: Internal Medicine

## 2017-01-21 ENCOUNTER — Encounter (HOSPITAL_COMMUNITY): Payer: Self-pay | Admitting: Emergency Medicine

## 2017-01-21 ENCOUNTER — Ambulatory Visit (INDEPENDENT_AMBULATORY_CARE_PROVIDER_SITE_OTHER): Payer: 59

## 2017-01-21 DIAGNOSIS — S99912A Unspecified injury of left ankle, initial encounter: Secondary | ICD-10-CM | POA: Diagnosis not present

## 2017-01-21 DIAGNOSIS — M25572 Pain in left ankle and joints of left foot: Secondary | ICD-10-CM

## 2017-01-21 NOTE — ED Provider Notes (Signed)
[facility]    CSN: [id number] Arrival date & time: 01/21/17  1138     History   Chief Complaint Chief Complaint  Patient presents with  . Ankle Pain    HPI Brooke Fernandez is a 31 y.o. female.   31 year old female comes in for 1 day history of left ankle pain. She was walking her dog, tripped over a branch, inverted ankle. She has been using ice compress and ace wrap without relief. Denies numbness, tingling, open wound. Has some swelling over lateral side of ankle. Is able to bear weight and ambulate.       Past Medical History:  Diagnosis Date  . Anxiety   . Depression     Patient Active Problem List   Diagnosis Date Noted  . Moderate episode of recurrent major depressive disorder (HCC) 12/27/2015  . Generalized anxiety disorder 12/27/2015    History reviewed. No pertinent surgical history.  OB History    No data available       Home Medications    Prior to Admission medications   Medication Sig Start Date End Date Taking? Authorizing Provider  b complex vitamins tablet Take 1 tablet by mouth daily.    [provider]  Biotin (BIOTIN MAXIMUM STRENGTH) 10 MG TABS Take by mouth.    [provider]  buPROPion (WELLBUTRIN XL) 300 MG 24 hr tablet Take 1 tablet (300 mg total) by mouth every morning. 10/16/16 10/16/17  Eksir, Bo Mcclintock, MD  Norethindrone-Ethinyl Estradiol-Fe Biphas (LO LOESTRIN FE) 1 MG-10 MCG / 10 MCG tablet Take 1 tablet by mouth daily. 11/19/16   Levie Heritage, DO  Specialty Vitamins Products (ADVANCED COLLAGEN) TABS Take 600 mg by mouth.    [provider]  vortioxetine HBr (TRINTELLIX) 5 MG TABS Take 1 tablet (5 mg total) by mouth daily. 01/16/17   Eksir, Bo Mcclintock, MD    Family History Family History  Problem Relation Age of Onset  . CAD Maternal Grandmother   . Colon cancer Maternal Grandfather   . Breast cancer Maternal Aunt   . Depression Mother   . Anxiety disorder Brother   . Depression  Brother     Social History Social History  Substance Use Topics  . Smoking status: Never Smoker  . Smokeless tobacco: Never Used  . Alcohol use 6.6 oz/week    5 Shots of liquor, 3 Cans of beer, 3 Glasses of wine per week     Allergies   Patient has no known allergies.   Review of Systems Review of Systems  Reason unable to perform ROS: See HPI as above.     Physical Exam Triage Vital Signs ED Triage Vitals  Enc Vitals Group     BP 01/21/17 1257 117/79     Pulse Rate 01/21/17 1257 84     Resp 01/21/17 1257 18     Temp 01/21/17 1257 98.1 F (36.7 C)     Temp Source 01/21/17 1257 Oral     SpO2 01/21/17 1257 100 %     Weight --      Height --      Head Circumference --      Peak Flow --      Pain Score 01/21/17 1255 7     Pain Loc --      Pain Edu? --      Excl. in GC? --    No data found.   Updated Vital Signs BP 117/79 (BP Location: Left Arm)  Pulse 84   Temp 98.1 F (36.7 C) (Oral)   Resp 18   SpO2 100%    Physical Exam  Constitutional: She is oriented to person, place, and time. She appears well-developed and well-nourished. No distress.  HENT:  Head: Normocephalic and atraumatic.  Eyes: Pupils are equal, round, and reactive to light. Conjunctivae are normal.  Musculoskeletal:  Swelling noted of the lateral left foot, around third to fourth metatarsal. No pain on palpation of the lateral and medial malleolus. Tenderness to palpation of proximal third and fourth metatarsal. Decrease flexion due to pain. Sensation intact and equal bilaterally. Pedal pulses 2+ and equal bilaterally.  Neurological: She is alert and oriented to person, place, and time.     UC Treatments / Results  Labs (all labs ordered are listed, but only abnormal results are displayed) Labs Reviewed - No data to display  EKG  EKG Interpretation None       Radiology Dg Ankle Complete Left  Result Date: 01/21/2017 CLINICAL DATA:  Patient states that she fell when dog took  off on leash yesterday, pain in the left ankle, unable to flex the foot. No priors EXAM: LEFT ANKLE COMPLETE - 3+ VIEW COMPARISON:  None. FINDINGS: There is no evidence of fracture, dislocation, or joint effusion. There is no evidence of arthropathy or other focal bone abnormality. Soft tissues are unremarkable. IMPRESSION: Negative. Electronically Signed   By: Corlis Leak  Hassell M.D.   On: 01/21/2017 13:18    Procedures Procedures (including critical care time)  Medications Ordered in UC Medications - No data to display   Initial Impression / Assessment and Plan / UC Course  I have reviewed the triage vital signs and the nursing notes.  Pertinent labs & imaging results that were available during my care of the patient were reviewed by me and considered in my medical decision making (see chart for details).     Discussed with patient x-ray negative for fractures, dislocation. Start NSAID as directed for pain and inflammation. Ice/heat compresses and elevation. Ankle brace during activity. Discussed with patient strain can take up to 3-4 weeks to resolve, but should be getting better each week. Return precautions given.   Final Clinical Impressions(s) / UC Diagnoses   Final diagnoses:  Acute left ankle pain    New Prescriptions New Prescriptions   No medications on file       Lurline IdolYu, Karthikeya Funke V, PA-C 01/21/17 1338

## 2017-01-21 NOTE — Discharge Instructions (Signed)
X-ray negative for fracture, dislocation. Take naproxen 440 mg twice a day for 10 days. Wear compression stockings, ankle brace during work. Ice compress, elevation for swelling. This can take up to 3-4 weeks to completely resolve, but he should be feeling better each week. Follow up here or with PCP for further evaluation and treatment if symptoms do not improve, if worsening symptoms, numbness/tingling, worsening swelling.

## 2017-01-21 NOTE — ED Triage Notes (Signed)
Twisted left ankle while walking her dog yesterday.  Pain where foot joins ankle.  Unable to flex left foot-too much pain.  Able to wiggle toes, pedal pulse in left foot is 2+

## 2017-01-31 ENCOUNTER — Ambulatory Visit (INDEPENDENT_AMBULATORY_CARE_PROVIDER_SITE_OTHER): Payer: 59 | Admitting: Licensed Clinical Social Worker

## 2017-01-31 DIAGNOSIS — F411 Generalized anxiety disorder: Secondary | ICD-10-CM

## 2017-01-31 DIAGNOSIS — F331 Major depressive disorder, recurrent, moderate: Secondary | ICD-10-CM | POA: Diagnosis not present

## 2017-02-06 ENCOUNTER — Encounter (HOSPITAL_COMMUNITY): Payer: Self-pay | Admitting: Licensed Clinical Social Worker

## 2017-02-06 NOTE — Progress Notes (Signed)
   THERAPIST PROGRESS NOTE  Session Time: 4:10-5pm  Participation Level: Active  Behavioral Response: Casual, Depressed  Type of Therapy: individual  Treatment Goals addressed: Coping  Interventions: CBT   Summary: Pt presented for her individual counseling session. Pt discussed her psychiatric symptoms and current life events. Pt has moved into her new house. Her mother came to help her move along with some of her coworkers. She now feels like a grown up having her own house. She feels her mother didn't validate her feelings about her home and criticized it. Pt met with Dr. Sharyon Medicus, psychiatrist, who monitors her meds. She feels like they are finally working well. Pt feels, "Ive earned my place at work. I feel credible and respected."   Suicidal/Homicidal: No/without intent/plan  Therapist Response: Assessed pt's current functioning. And Evaluated patients current functioning and reviewed progress. Assisted pt processing validation, mother, medication.  Plan: Return again in 4 weeks. Begin to journal childhood memories. Begin discussion of childhood.  Diagnosis: Axis I: Moderate episode of recurrent major depressive disorder, GAD        Elishia Kaczorowski S, LCAS 01/31/17

## 2017-02-12 ENCOUNTER — Ambulatory Visit (INDEPENDENT_AMBULATORY_CARE_PROVIDER_SITE_OTHER): Payer: 59 | Admitting: Licensed Clinical Social Worker

## 2017-02-12 DIAGNOSIS — F331 Major depressive disorder, recurrent, moderate: Secondary | ICD-10-CM | POA: Diagnosis not present

## 2017-02-12 DIAGNOSIS — F411 Generalized anxiety disorder: Secondary | ICD-10-CM

## 2017-02-14 ENCOUNTER — Encounter (HOSPITAL_COMMUNITY): Payer: Self-pay | Admitting: Licensed Clinical Social Worker

## 2017-02-14 NOTE — Progress Notes (Signed)
   THERAPIST PROGRESS NOTE  Session Time: 4:10-5pm  Participation Level: Active  Behavioral Response: Casual, Depressed  Type of Therapy: individual  Treatment Goals addressed: Coping  Interventions: CBT   Summary: Pt presented for her individual counseling session. Pt discussed her psychiatric symptoms and current life events. Pt is enjoying her new house, her job is less stressful and she has met someone she enjoys spending time with. Pt reports she has been tracking her mood which have remained more constant. Pt brought in her journal where she had written about her earliest childhood memories. Taught basic cbt concepts; pt was able to connect her thoughts and emotions with alternative perspectives. Pt will continue to write in her journal about memories and bring it with her to session.  Suicidal/Homicidal: No/without intent/plan  Therapist Response: Evaluated patients current functioning and reviewed progress. Assisted pt processing childhood memories, validation, parents, emotions.  Plan: Return again in 2 weeks. Continue to journal childhood memories. Continues discussion of childhood.  Diagnosis: Axis I: Moderate episode of recurrent major depressive disorder, GAD        Taysia Rivere S, LCAS 02/12/17

## 2017-02-27 ENCOUNTER — Ambulatory Visit (INDEPENDENT_AMBULATORY_CARE_PROVIDER_SITE_OTHER): Payer: 59 | Admitting: Licensed Clinical Social Worker

## 2017-02-27 ENCOUNTER — Encounter (HOSPITAL_COMMUNITY): Payer: Self-pay | Admitting: Licensed Clinical Social Worker

## 2017-02-27 DIAGNOSIS — F331 Major depressive disorder, recurrent, moderate: Secondary | ICD-10-CM | POA: Diagnosis not present

## 2017-02-27 NOTE — Progress Notes (Signed)
   THERAPIST PROGRESS NOTE  Session Time: 4:10-5pm  Participation Level: Active  Behavioral Response: Casual, Depressed  Type of Therapy: individual  Treatment Goals addressed: Coping  Interventions: CBT   Summary: Pt presented for her individual counseling session. Pt discussed her psychiatric symptoms and current life events. Pt just returned from a cruise with her family. It went well and pt was able to talk to her mother and step father about some childhood issues that pt had been working on in therapy. Pt was able to express herself in a positive non confrontational manner. Pt is still enjoying her new relationship and her confidence in her job. Discussed basic cbt concepts with pt and the thought emotion connection. Pt was able to use the CBT concept in working through her relationship. Pt was open to the CBT concept. Pt is still using her morning meditation to get her day started positive. Encouraged pt to continue to practice her mindfulness techniques learned in session. Pt has a high stress job and encouraged her to use the techniques at work.   Suicidal/Homicidal: No/without intent/plan  Therapist Response: Evaluated patients current functioning and reviewed progress. Assisted pt in cbt concepts, meditation, mindfulness techniques, family. Assisted pt processing for the management of her stressors.  Plan: Return again in 2 weeks. Continue to journal childhood memories. Continue discussion of childhood.  Diagnosis: Axis I: Moderate episode of recurrent major depressive disorder        Tyyne Cliett S, LCAS  02/27/17

## 2017-03-11 ENCOUNTER — Ambulatory Visit (INDEPENDENT_AMBULATORY_CARE_PROVIDER_SITE_OTHER): Payer: 59 | Admitting: Licensed Clinical Social Worker

## 2017-03-11 DIAGNOSIS — F331 Major depressive disorder, recurrent, moderate: Secondary | ICD-10-CM | POA: Diagnosis not present

## 2017-03-13 ENCOUNTER — Encounter (HOSPITAL_COMMUNITY): Payer: Self-pay | Admitting: Licensed Clinical Social Worker

## 2017-03-13 NOTE — Progress Notes (Signed)
   THERAPIST PROGRESS NOTE  Session Time: 4:10-5pm  Participation Level: Active  Behavioral Response: Casual/Anxiety  Type of Therapy: individual  Treatment Goals addressed: Coping/Anxiety  Interventions: CBT/DBT   Summary: Pt presented for her individual counseling session. Pt discussed her psychiatric symptoms and current life events. Pt presented anxious today. Her new boyfriend has just returned from a 10 day vacation. She was concerned about his expectations upon seeing him. Processed expectations with pt. Pt was open to alternative thoughts of expectations. Taught pt, "check the facts" on thoughts and beliefs that she has. Gave pt worksheet on "alternative thoughts." Processed worksheet with pt.  Taught pt the RAIN meditation to use. Suggested to pt different coping tools to use when her anxiety spirals: candles, meditation, journal feelings/thoughts, breathing.    Suicidal/Homicidal: No/without intent/plan  Therapist Response: Evaluated patients current functioning and reviewed progress. Assisted pt in cbt/DBT concepts, meditation, mindfulness techniques, expectations,  Assisted pt processing for the management of her stressors.  Plan: Return again in 2 weeks. Continue to journal childhood memories. Continue discussion of childhood.  Diagnosis: Axis I: Moderate episode of recurrent major depressive disorder        MACKENZIE,LISBETH S, LCAS  03/11/17

## 2017-03-21 DIAGNOSIS — H52223 Regular astigmatism, bilateral: Secondary | ICD-10-CM | POA: Diagnosis not present

## 2017-03-21 DIAGNOSIS — H5213 Myopia, bilateral: Secondary | ICD-10-CM | POA: Diagnosis not present

## 2017-03-25 ENCOUNTER — Ambulatory Visit (INDEPENDENT_AMBULATORY_CARE_PROVIDER_SITE_OTHER): Payer: 59 | Admitting: Licensed Clinical Social Worker

## 2017-03-25 DIAGNOSIS — F331 Major depressive disorder, recurrent, moderate: Secondary | ICD-10-CM

## 2017-03-27 ENCOUNTER — Encounter (HOSPITAL_COMMUNITY): Payer: Self-pay | Admitting: Licensed Clinical Social Worker

## 2017-03-27 NOTE — Progress Notes (Signed)
   THERAPIST PROGRESS NOTE  Session Time: 4:10-4:45pm  Participation Level: Active  Behavioral Response: Casual/Alert/anxious  Type of Therapy: individual  Treatment Goals addressed: Depressive symptoms  Interventions: CBT   Summary: Pt presented for her individual counseling session. Pt discussed her psychiatric symptoms and current life events. Pt presented anxious today.  She is on call tonight and the anticipation of on-call makes her anxious. Pt reports even thought she believes her meds are working she is still having some low moods. Pt has been tracking her moods and discussed the highs and lows with pt. To look for patterns. Assisted pt in gaining awareness of her moods. Due to the time change suggested to pt that she may consider a light box to assist with low mood. Assisted pt in building hope and strengthening resiliency.    Suicidal/Homicidal: No/without intent/plan  Therapist Response: Evaluated patients current functioning and reviewed progress. Assisted pt in cbt concepts, anxiety, mindfulness techniques, mood tracking.  Assisted pt processing for the management of her stressors.  Plan: Return again in 2 weeks. Continue to journal childhood memories. Continue discussion of childhood.  Diagnosis: Axis I: Moderate episode of recurrent major depressive disorder        MACKENZIE,LISBETH S, LCAS  03/25/17

## 2017-04-08 ENCOUNTER — Ambulatory Visit (HOSPITAL_COMMUNITY): Payer: Self-pay | Admitting: Licensed Clinical Social Worker

## 2017-04-16 ENCOUNTER — Ambulatory Visit (INDEPENDENT_AMBULATORY_CARE_PROVIDER_SITE_OTHER): Payer: 59 | Admitting: Licensed Clinical Social Worker

## 2017-04-16 DIAGNOSIS — F331 Major depressive disorder, recurrent, moderate: Secondary | ICD-10-CM | POA: Diagnosis not present

## 2017-04-17 ENCOUNTER — Encounter (HOSPITAL_COMMUNITY): Payer: Self-pay | Admitting: Licensed Clinical Social Worker

## 2017-04-17 NOTE — Progress Notes (Signed)
   THERAPIST PROGRESS NOTE  Session Time: 4:10-4:45pm  Participation Level: Active  Behavioral Response: Casual/Alert/anxious  Type of Therapy: individual  Treatment Goals addressed: Depressive symptoms  Interventions: CBT   Summary: Pt presented for her individual counseling session. Pt discussed her psychiatric symptoms and current life events. Pt had called and asked to be worked in today for a session. Pt's mother has moved in and is causing stress for pt. Pt allowed her mom to move in temporarily. Pt is an intravert and she has no alone time now. Processed this at length with pt. Discussed basic CBT concepts and the thought emotion connection and alternative perspectives. Pt continues to use her coping tools. Pt was less anxious after session.   Suicidal/Homicidal: No/without intent/plan  Therapist Response: Evaluated patients current functioning and reviewed progress. Assisted pt in cbt concepts, recharging.  Assisted pt processing for the management of her stressors.  Plan: Return again in 2 weeks to re discuss her relationship with her father. Diagnosis: Axis I: Moderate episode of recurrent major depressive disorder        MACKENZIE,LISBETH S, LCAS  04/16/17

## 2017-04-18 ENCOUNTER — Encounter (HOSPITAL_COMMUNITY): Payer: Self-pay | Admitting: Psychiatry

## 2017-04-18 ENCOUNTER — Ambulatory Visit (INDEPENDENT_AMBULATORY_CARE_PROVIDER_SITE_OTHER): Payer: 59 | Admitting: Psychiatry

## 2017-04-18 DIAGNOSIS — F411 Generalized anxiety disorder: Secondary | ICD-10-CM | POA: Diagnosis not present

## 2017-04-18 DIAGNOSIS — F509 Eating disorder, unspecified: Secondary | ICD-10-CM | POA: Diagnosis not present

## 2017-04-18 DIAGNOSIS — Z818 Family history of other mental and behavioral disorders: Secondary | ICD-10-CM

## 2017-04-18 DIAGNOSIS — F331 Major depressive disorder, recurrent, moderate: Secondary | ICD-10-CM

## 2017-04-18 DIAGNOSIS — Z79899 Other long term (current) drug therapy: Secondary | ICD-10-CM | POA: Diagnosis not present

## 2017-04-18 MED ORDER — BUPROPION HCL ER (XL) 300 MG PO TB24: 300.0000 mg | ORAL_TABLET | ORAL | 1 refills | Status: DC

## 2017-04-18 MED ORDER — VORTIOXETINE HBR 10 MG PO TABS: 10.0000 mg | ORAL_TABLET | ORAL | 1 refills | Status: DC

## 2017-04-18 NOTE — Progress Notes (Signed)
BH MD/PA/NP OP Progress Note  04/18/2017 4:44 PM Brooke Fernandez  MRN:  [id number]  Chief Complaint: irritable, better HPI: Brooke Fernandez has responded well to trintellix. GI/nausea has gradually dissipated. She wonders about increasing the dose to assist with seasonal worsening of mood/anxiety.  Shares multiple external stressors including  Positive stressors.  She has a BF of 2 months, things are going well and they are both in healthcare. She reports that her mom[place] and this is also a positive/mixed stress.  She is close with family, but also wants to maintain bounderies.  Discussed increase of trintellix to 10 mg, and then 20 mg as tolerated after 6-8 weeks.  Reiterated the risks of SRI medications and nausea takes time to improve particularly with vortioxetine. She denies any purging, binging, restriction, SI or cutting. She reports that she has had the urge to vomit/purge after thanksgiving but she used coping strategies to avoid maladaptive behaviors.   Visit Diagnosis:    ICD-10-CM   1. Moderate episode of recurrent major depressive disorder (HCC) F33.1 vortioxetine HBr (TRINTELLIX) 10 MG TABS    buPROPion (WELLBUTRIN XL) 300 MG 24 hr tablet  2. Generalized anxiety disorder F41.1 vortioxetine HBr (TRINTELLIX) 10 MG TABS    buPROPion (WELLBUTRIN XL) 300 MG 24 hr tablet  3. Eating disorder F50.9 vortioxetine HBr (TRINTELLIX) 10 MG TABS    buPROPion (WELLBUTRIN XL) 300 MG 24 hr tablet    Past Psychiatric History: See intake H&P for full details. Reviewed, with no updates at this time.   Past Medical History:  Past Medical History:  Diagnosis Date  . Anxiety   . Depression    No past surgical history on file.  Family Psychiatric History: See intake H&P for full details. Reviewed, with no updates at this time.   Family History:  Family History  Problem Relation Age of Onset  . CAD Maternal Grandmother   . Colon cancer Maternal Grandfather   . Breast  cancer Maternal Aunt   . Depression Mother   . Anxiety disorder Brother   . Depression Brother     Social History:  Social History   Socioeconomic History  . Marital status: Single    Spouse name: Not on file  . Number of children: Not on file  . Years of education: Not on file  . Highest education level: Not on file  Social Needs  . Financial resource strain: Not on file  . Food insecurity - worry: Not on file  . Food insecurity - inability: Not on file  . Transportation needs - medical: Not on file  . Transportation needs - non-medical: Not on file  Occupational History  . Not on file  Tobacco Use  . Smoking status: Never Smoker  . Smokeless tobacco: Never Used  Substance and Sexual Activity  . Alcohol use: Yes    Alcohol/week: 6.6 oz    Types: 5 Shots of liquor, 3 Cans of beer, 3 Glasses of wine per week  . Drug use: No  . Sexual activity: Not Currently    Birth control/protection: Pill  Other Topics Concern  . Not on file  Social History Narrative  . Not on file    Allergies: No Known Allergies  Metabolic Disorder Labs: No results found for: HGBA1C, MPG No results found for: PROLACTIN Lab Results  Component Value Date   CHOL 158 11/14/2015   TRIG 96 11/14/2015   HDL 74 11/14/2015   CHOLHDL 2.1 11/14/2015   VLDL 19 11/14/2015  LDLCALC 65 11/14/2015   No results found for: TSH  Therapeutic Level Labs: No results found for: LITHIUM No results found for: VALPROATE No components found for:  CBMZ  Current Medications: Current Outpatient Medications  Medication Sig Dispense Refill  . b complex vitamins tablet Take 1 tablet by mouth daily.    . Biotin (BIOTIN MAXIMUM STRENGTH) 10 MG TABS Take by mouth.    Marland Kitchen buPROPion (WELLBUTRIN XL) 300 MG 24 hr tablet Take 1 tablet (300 mg total) by mouth every morning. 90 tablet 1  . Norethindrone-Ethinyl Estradiol-Fe Biphas (LO LOESTRIN FE) 1 MG-10 MCG / 10 MCG tablet Take 1 tablet by mouth daily. 3 Package 3  .  Specialty Vitamins Products (ADVANCED COLLAGEN) TABS Take 600 mg by mouth.    . vortioxetine HBr (TRINTELLIX) 10 MG TABS Take 1 tablet (10 mg total) by mouth every morning. 90 tablet 1   No current facility-administered medications for this visit.      Musculoskeletal: Strength & Muscle Tone: within normal limits Gait & Station: normal Patient leans: N/A  Psychiatric Specialty Exam: ROS  There were no vitals taken for this visit.There is no height or weight on file to calculate BMI.  General Appearance: Casual and Well Groomed  Eye Contact:  Fair  Speech:  Clear and Coherent  Volume:  Normal  Mood:  Depressed and Irritable  Affect:  Congruent  Thought Process:  Coherent and Descriptions of Associations: Intact  Orientation:  Full (Time, Place, and Person)  Thought Content: Logical   Suicidal Thoughts:  No  Homicidal Thoughts:  No  Memory:  Immediate;   Fair  Judgement:  Fair  Insight:  Good  Psychomotor Activity:  Normal  Concentration:  Attention Span: Good  Recall:  Good  Fund of Knowledge: Good  Language: Good  Akathisia:  Negative  Handed:  Right  AIMS (if indicated): not done  Assets:  Communication Skills Desire for Improvement Financial Resources/Insurance Housing Intimacy Physical Health Resilience Social Support Talents/Skills Transportation Vocational/Educational  ADL's:  Intact  Cognition: WNL  Sleep:  Good   Screenings: GAD-7     [facility]  Total GAD-7 Score  10    PHQ2-9     [facility]  PHQ-2 Total Score  1  PHQ-9 Total Score  3       Assessment and Plan:  Brooke Poag presents with slight improvement in anxiety/mood. She has accomidated to the nausea/gi side effects of vortioxetine and would benefit from titration to 10 mg.  Overall, trending towards improvement. Reports therapy has been a significant benefit and she intends to  continue weekly.  She has had positive stressors as above, and we reviewed strategies to maintain positive progress and boundaries.  No acute SI, or purging or self harm.  Will RTC 3 months or sooner if needed.  1. Moderate episode of recurrent major depressive disorder (HCC)   2. Generalized anxiety disorder   3. Eating disorder     Status of current problems: gradually improving  Labs Ordered: No orders of the defined types were placed in this encounter.   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  Greater than 50% of this time was spent in counseling and coordination of care with the patient.    Burnard LeighAlexander Arya Welles Walthall, MD 04/18/2017, 4:44 PM

## 2017-04-22 ENCOUNTER — Encounter (HOSPITAL_COMMUNITY): Payer: Self-pay | Admitting: Licensed Clinical Social Worker

## 2017-04-22 ENCOUNTER — Ambulatory Visit (HOSPITAL_COMMUNITY): Payer: 59 | Admitting: Licensed Clinical Social Worker

## 2017-04-22 DIAGNOSIS — F331 Major depressive disorder, recurrent, moderate: Secondary | ICD-10-CM

## 2017-04-22 NOTE — Progress Notes (Signed)
   THERAPIST PROGRESS NOTE  Session Time: 4:10-4:45pm  Participation Level: Active  Behavioral Response: Casual/Alert/anxious  Type of Therapy: individual  Treatment Goals addressed: Depressive symptoms  Interventions: CBT   Summary: Pt presented for her individual counseling session. Pt discussed her psychiatric symptoms and current life events. Pt presented for therapy reporting that she feels she has made a lot of progress in therapy. She has grown and in most areas of her life she is doing well, except, the relationship with her father. Pt has a lot of issues from her childhood to the present day with her father. She would like to cut off all contact with him. Used empathic reflection and asked open ended questions about her decision. Pt wants to write him a letter about her decision. Validated pt's feelings about her decision and used problem solving skills for next steps. Pt will begin writing a letter and will bring it to next session. Processed her decision with pt.    Suicidal/Homicidal: No/without intent/plan  Therapist Response: Evaluated patients current functioning and reviewed progress. Assisted pt using empathic reflection, asked open ended questions, validated pt's feelings, problem solved next steps.  Assisted pt processing for the management of her stressors.  Plan: Return again in 2 weeks to re discuss her relationship with her father.  Diagnosis:   Axis I: Moderate episode of recurrent major depressive disorder        Charish Schroepfer S, LCAS  04/22/17

## 2017-05-06 ENCOUNTER — Ambulatory Visit (INDEPENDENT_AMBULATORY_CARE_PROVIDER_SITE_OTHER): Payer: 59 | Admitting: Licensed Clinical Social Worker

## 2017-05-06 DIAGNOSIS — F331 Major depressive disorder, recurrent, moderate: Secondary | ICD-10-CM

## 2017-05-07 ENCOUNTER — Encounter (HOSPITAL_COMMUNITY): Payer: Self-pay | Admitting: Licensed Clinical Social Worker

## 2017-05-07 NOTE — Progress Notes (Signed)
   THERAPIST PROGRESS NOTE  Session Time: 4:10-5pm  Participation Level: Active  Behavioral Response: Casual/Alert/anxious  Type of Therapy: individual  Treatment Goals addressed: Depressive symptoms  Interventions: CBT   Summary: Pt presented for her individual counseling session. Pt discussed her psychiatric symptoms and current life events. Pt presents stressed today as she is on call for tonight, always a stressor. Her mother has moved in temporarily but that is going fine. Pt celebrated her [place] in [place] with her boyfriend. Pt is struggling with destructive thoughts in her relationship. Taught pt CBT concepts with thought emotion connection and alternative thoughts. Pt is still contemplating relationship with father. Suggested she write her thoughts down in journal to discuss at next session.      Suicidal/Homicidal: No/without intent/plan  Therapist Response: Evaluated patients current functioning and reviewed progress. Assisted pt with CBT concepts, destructive thinking, relationships Plan: Return again in 2 weeks to re discuss her relationship with her father.  Diagnosis:   Axis I: Moderate episode of recurrent major depressive disorder        Leisha Trinkle S, LCAS  05/06/17

## 2017-05-20 ENCOUNTER — Ambulatory Visit (HOSPITAL_COMMUNITY): Payer: Self-pay | Admitting: Licensed Clinical Social Worker

## 2017-05-22 ENCOUNTER — Encounter (HOSPITAL_COMMUNITY): Payer: Self-pay | Admitting: Psychiatry

## 2017-05-28 ENCOUNTER — Encounter (HOSPITAL_COMMUNITY): Payer: Self-pay | Admitting: Licensed Clinical Social Worker

## 2017-05-28 ENCOUNTER — Ambulatory Visit (INDEPENDENT_AMBULATORY_CARE_PROVIDER_SITE_OTHER): Payer: 59 | Admitting: Licensed Clinical Social Worker

## 2017-05-28 DIAGNOSIS — F331 Major depressive disorder, recurrent, moderate: Secondary | ICD-10-CM | POA: Diagnosis not present

## 2017-05-28 NOTE — Progress Notes (Signed)
   THERAPIST PROGRESS NOTE  Session Time: 4:10-5pm  Participation Level: Active  Behavioral Response: Casual/Alert/anxious  Type of Therapy: individual  Treatment Goals addressed: Depressive symptoms  Interventions: CBT   Summary: Pt presented for her individual counseling session. Pt discussed her psychiatric symptoms and current life events. Pt presents stressed today. Pt's mother is living with her and it has begun to affect all of pt's life.  Asked open ended question about living situation and how to resolve.  Role played with pt how to have an adult conversation with her mother.  Pt is still contemplating relationship with father. Pt brought in her journal to show her thoughts and feelings about the relationship with her father. Used empathic reflection with pt.       Suicidal/Homicidal: No/without intent/plan  Therapist Response: Evaluated patients current functioning and reviewed progress. Assisted pt with role playing adult conversation with mother, relationship with father, empathic reflection. Plan: Return again in 2 weeks to re discuss her relationship with her father.  Diagnosis:   Axis I: Moderate episode of recurrent major depressive disorder        MACKENZIE,LISBETH S, LCAS  05/28/17

## 2017-05-31 ENCOUNTER — Encounter (HOSPITAL_COMMUNITY): Payer: Self-pay | Admitting: Psychiatry

## 2017-05-31 NOTE — Telephone Encounter (Signed)
Hey there, not sure what these documents are for...can you tell?

## 2017-06-11 ENCOUNTER — Ambulatory Visit (INDEPENDENT_AMBULATORY_CARE_PROVIDER_SITE_OTHER): Payer: 59 | Admitting: Licensed Clinical Social Worker

## 2017-06-11 ENCOUNTER — Encounter (HOSPITAL_COMMUNITY): Payer: Self-pay | Admitting: Licensed Clinical Social Worker

## 2017-06-11 DIAGNOSIS — F331 Major depressive disorder, recurrent, moderate: Secondary | ICD-10-CM

## 2017-06-11 NOTE — Progress Notes (Signed)
   THERAPIST PROGRESS NOTE  Session Time: 4:10-5pm  Participation Level: Active  Behavioral Response: Casual/Alert/anxious  Type of Therapy: individual  Treatment Goals addressed: Depressive symptoms  Interventions: CBT   Summary: Pt presented for her individual counseling session. Pt discussed her psychiatric symptoms and current life events. Pt feels her symptoms are better and her medications are working well. Encouraged pt to contact Dr. Gaspar SkeetersEskir if she has other medication concerns. Pt is restless and upset about her job and is looking at alternatives, perhaps going to NP school. She has discussed this with her mother and boyfriend. Discussed realistic pros and cons of this decision. Pt reports the relationship with her mother has improved due to open communication and boundaries. Pt is going to [place] this week with her boyfriend to meet his parents. Pt expressed she has no anxiety about the trip. Encouraged pt to use her self soothing skills if needed during the trip. Pt still continues to avoid discussion about her father. Pt is in agreement to the avoidance.     Suicidal/Homicidal: No/without intent/plan  Therapist Response: Evaluated patients current functioning and reviewed progress. Assisted pt with decision making, future goals, self soothing, avoidance, relationship with father.  Plan: Return again in 2 weeks to re discuss her relationship with her father.  Diagnosis:   Axis I: Moderate episode of recurrent major depressive disorder        MACKENZIE,LISBETH S, LCAS  06/11/17

## 2017-06-12 ENCOUNTER — Encounter (HOSPITAL_COMMUNITY): Payer: Self-pay | Admitting: Psychiatry

## 2017-06-13 ENCOUNTER — Other Ambulatory Visit (HOSPITAL_COMMUNITY): Payer: Self-pay | Admitting: Psychiatry

## 2017-06-13 DIAGNOSIS — F411 Generalized anxiety disorder: Secondary | ICD-10-CM

## 2017-06-13 DIAGNOSIS — F331 Major depressive disorder, recurrent, moderate: Secondary | ICD-10-CM

## 2017-06-13 DIAGNOSIS — F509 Eating disorder, unspecified: Secondary | ICD-10-CM

## 2017-06-13 MED ORDER — VORTIOXETINE HBR 20 MG PO TABS: 20.0000 mg | ORAL_TABLET | Freq: Every day | ORAL | 1 refills | Status: DC

## 2017-06-24 ENCOUNTER — Ambulatory Visit (INDEPENDENT_AMBULATORY_CARE_PROVIDER_SITE_OTHER): Payer: 59 | Admitting: Licensed Clinical Social Worker

## 2017-06-24 DIAGNOSIS — F331 Major depressive disorder, recurrent, moderate: Secondary | ICD-10-CM | POA: Diagnosis not present

## 2017-06-25 ENCOUNTER — Encounter (HOSPITAL_COMMUNITY): Payer: Self-pay | Admitting: Licensed Clinical Social Worker

## 2017-06-25 NOTE — Progress Notes (Signed)
   THERAPIST PROGRESS NOTE  Session Time: 4:10-5pm  Participation Level: Active  Behavioral Response: Casual/Alert/anxious  Type of Therapy: individual  Treatment Goals addressed: Depressive symptoms  Interventions: CBT   Summary: Pt presented for her individual counseling session. Pt discussed her psychiatric symptoms and current life events. Pt feels her symptoms are better and her medications are working well. Pt reports her moods have stabilized and her medications are currently working well. Pt just returned from a week's vacation with her boyfriend, visiting his parents in [place]. SHe really enjoyed it and his family treated her well. However, it brought up feelings about her own family dynamic. Used empathic reflection and asked open ended questions. Pt continues with her self care and growing in her emotional intelligence. Discussed emotional intelligence with pt. Gave her handout on EI.          Suicidal/Homicidal: No/without intent/plan  Therapist Response: Evaluated patients current functioning and reviewed progress. Assisted pt processing family dysfunction using empathic reflection and asking open ended questions. Discussed Emotional Intelligence  with  Pt. Assisted pt processing for he management of her stressors.  Plan: Return again in 2 weeks  Diagnosis:   Axis I: Moderate episode of recurrent major depressive disorder        Logen Heintzelman S, LCAS  06/25/17

## 2017-07-11 ENCOUNTER — Encounter (HOSPITAL_COMMUNITY): Payer: Self-pay | Admitting: Licensed Clinical Social Worker

## 2017-07-11 ENCOUNTER — Ambulatory Visit (INDEPENDENT_AMBULATORY_CARE_PROVIDER_SITE_OTHER): Payer: 59 | Admitting: Licensed Clinical Social Worker

## 2017-07-11 DIAGNOSIS — F331 Major depressive disorder, recurrent, moderate: Secondary | ICD-10-CM | POA: Diagnosis not present

## 2017-07-11 NOTE — Progress Notes (Signed)
   THERAPIST PROGRESS NOTE  Session Time: 4:10-5pm  Participation Level: Active  Behavioral Response: Casual/Alert/anxious  Type of Therapy: individual  Treatment Goals addressed: Depressive symptoms  Interventions: CBT/DBT   Summary: Pt presented for her individual counseling session. Pt discussed her psychiatric symptoms and current life events. Pt feels her symptoms are better and her medications are working well. Pt reports, I'm doing pretty good." Asked pt open ended questions about her current life events. Pt is having some symptoms of her eating disorder. Discussed a referral to dietician, nutritionist. Decided on a referral to an eating disorder therapist. Pt reports she has been working on her emotional regulation skills and low frustration tolerance. Taught pt"check the facts." Pt and clinical discussed healthy coping skills.      Suicidal/Homicidal: No/without intent/plan  Therapist Response: Evaluated patients current functioning and reviewed progress. Assisted pt processing current life events, low frustration tolerance, check the facts,, healthy coping skills. Assisted pt processing for he management of her stressors.  Plan: Return again in 2 weeks. Make a referral to eating disorder therapist.  Diagnosis:   Axis I: Moderate episode of recurrent major depressive disorder        Damain Broadus S, LCAS  07/11/17

## 2017-07-16 ENCOUNTER — Encounter (HOSPITAL_COMMUNITY): Payer: Self-pay | Admitting: Psychiatry

## 2017-07-16 ENCOUNTER — Ambulatory Visit (INDEPENDENT_AMBULATORY_CARE_PROVIDER_SITE_OTHER): Payer: 59 | Admitting: Psychiatry

## 2017-07-16 DIAGNOSIS — Z5689 Other problems related to employment: Secondary | ICD-10-CM | POA: Diagnosis not present

## 2017-07-16 DIAGNOSIS — Z79899 Other long term (current) drug therapy: Secondary | ICD-10-CM

## 2017-07-16 DIAGNOSIS — Z818 Family history of other mental and behavioral disorders: Secondary | ICD-10-CM | POA: Diagnosis not present

## 2017-07-16 DIAGNOSIS — F509 Eating disorder, unspecified: Secondary | ICD-10-CM

## 2017-07-16 DIAGNOSIS — F331 Major depressive disorder, recurrent, moderate: Secondary | ICD-10-CM

## 2017-07-16 DIAGNOSIS — Z565 Uncongenial work environment: Secondary | ICD-10-CM | POA: Diagnosis not present

## 2017-07-16 DIAGNOSIS — F411 Generalized anxiety disorder: Secondary | ICD-10-CM

## 2017-07-16 MED ORDER — BUPROPION HCL ER (XL) 300 MG PO TB24: 300.0000 mg | ORAL_TABLET | ORAL | 1 refills | Status: DC

## 2017-07-16 NOTE — Progress Notes (Signed)
BH MD/PA/NP OP Progress Note  07/16/2017 4:09 PM Brooke Fernandez  MRN:  [id number]  Chief Complaint: med management   HPI: Brooke Fernandez reports that in her personal life things are going well, she and her boyfriend are getting along great, and they are progressing in their relationship.  She reports that she is sleeping fairly well at night.  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  She reports that she continues to work with her individual therapist, and I spent time with the patient noting that she has taken her mental health needs quite seriously and participated well in all the recommended interventions.  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  I spent time with her expressing my support that she consider transitioning or transferring to a different nursing specialty within the system, and she is going to do some soul searching and consider whether or not this would be a good change for her.  No safety issues, she has not engaged in purging behaviors.  She has not engaged in any cutting.  She reports that her medications have not caused her any significant side effects except for some mild nausea associated with Trintellix, but that is gradually improving.  Visit Diagnosis:    ICD-10-CM   1. Moderate episode of recurrent major depressive disorder (HCC) F33.1 buPROPion (WELLBUTRIN XL) 300 MG 24 hr tablet  2. Generalized anxiety disorder F41.1 buPROPion (WELLBUTRIN XL) 300 MG 24 hr tablet  3. Eating disorder F50.9 buPROPion (WELLBUTRIN XL) 300 MG 24 hr tablet    Past Psychiatric History: See intake H&P for full details. Reviewed, with no updates at this  time.   Past Medical History:  Past Medical History:  Diagnosis Date  . Anxiety   . Depression    No past surgical history on file.  Family Psychiatric History: See intake H&P for full details. Reviewed, with no updates at this time.   Family History:  Family History  Problem Relation Age of Onset  . CAD Maternal Grandmother   . Colon cancer Maternal Grandfather   . Breast cancer Maternal Aunt   . Depression Mother   . Anxiety disorder Brother   . Depression Brother     Social History:  Social History   Socioeconomic History  . Marital status: Single    Spouse name: None  . Number of children: None  . Years of education: None  . Highest education level: None  Social Needs  . Financial resource strain: None  . Food insecurity - worry: None  . Food insecurity - inability: None  . Transportation needs - medical: None  . Transportation needs - non-medical: None  Occupational History  . None  Tobacco Use  . Smoking status: Never Smoker  . Smokeless tobacco: Never Used  Substance and Sexual Activity  . Alcohol use: Yes    Alcohol/week: 6.6 oz    Types: 5 Shots of liquor, 3 Cans of beer, 3 Glasses of wine per week  . Drug use: No  . Sexual activity: Not Currently    Birth control/protection: Pill  Other Topics Concern  . None  Social History Narrative  . None    Allergies: No  Known Allergies  Metabolic Disorder Labs: No results found for: HGBA1C, MPG No results found for: PROLACTIN Lab Results  Component Value Date   CHOL 158 11/14/2015   TRIG 96 11/14/2015   HDL 74 11/14/2015   CHOLHDL 2.1 11/14/2015   VLDL 19 11/14/2015   LDLCALC 65 11/14/2015   No results found for: TSH  Therapeutic Level Labs: No results found for: LITHIUM No results found for: VALPROATE No components found for:  CBMZ  Current Medications: Current Outpatient Medications  Medication Sig Dispense Refill  . b complex vitamins tablet Take 1 tablet by mouth daily.    . Biotin  (BIOTIN MAXIMUM STRENGTH) 10 MG TABS Take by mouth.    Marland Kitchen buPROPion (WELLBUTRIN XL) 300 MG 24 hr tablet Take 1 tablet (300 mg total) by mouth every morning. 90 tablet 1  . Norethindrone-Ethinyl Estradiol-Fe Biphas (LO LOESTRIN FE) 1 MG-10 MCG / 10 MCG tablet Take 1 tablet by mouth daily. 3 Package 3  . Specialty Vitamins Products (ADVANCED COLLAGEN) TABS Take 600 mg by mouth.    . vortioxetine HBr (TRINTELLIX) 20 MG TABS Take 20 mg by mouth daily. 90 tablet 1   No current facility-administered medications for this visit.      Musculoskeletal: Strength & Muscle Tone: within normal limits Gait & Station: normal Patient leans: N/A  Psychiatric Specialty Exam: ROS  Blood pressure 108/72, pulse 74, height 5\' 4"  (1.626 m), weight 131 lb 3.2 oz (59.5 kg).Body mass index is 22.52 kg/m.  General Appearance: Casual and Fairly Groomed  Eye Contact:  Fair  Speech:  Clear and Coherent and Normal Rate  Volume:  Normal  Mood:  Euthymic  Affect:  Appropriate and Congruent  Thought Process:  Coherent, Goal Directed and Descriptions of Associations: Intact  Orientation:  Full (Time, Place, and Person)  Thought Content: Logical   Suicidal Thoughts:  No  Homicidal Thoughts:  No  Memory:  Immediate;   Good  Judgement:  Good  Insight:  Good  Psychomotor Activity:  Normal  Concentration:  Concentration: Good  Recall:  Good  Fund of Knowledge: Good  Language: Good  Akathisia:  Negative  Handed:  Right  AIMS (if indicated): not done  Assets:  Communication Skills Desire for Improvement Financial Resources/Insurance Housing Intimacy Leisure Time Physical Health Resilience Social Support Talents/Skills Transportation Vocational/Educational  ADL's:  Intact  Cognition: WNL  Sleep:  Good   Screenings: GAD-7     [facility]  Total GAD-7 Score  10    PHQ2-9     [facility]  PHQ-2  Total Score  1  PHQ-9 Total Score  3       Assessment and Plan:  Donnamarie Poag presents with gradual improvement of her mood symptoms, except for some ongoing difficulties in the context of burnout with her work environment.  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  She has participated well in individual therapy, and seems to make significant efforts to try to cope with the work stressors.  She does not present any acute safety issues.  We have agreed to continue her current medication regimen and she will consider some changes to her work environment and may ultimately request a letter of  support from this office, which I am happy to provide.  1. Moderate episode of recurrent major depressive disorder (HCC)   2. Generalized anxiety disorder   3. Eating disorder     Status of current problems: stable  Labs Ordered: No orders of the defined types were placed in this encounter.   Labs Reviewed: N/A  Collateral Obtained/Records Reviewed: N/A  Plan:  Continue Trintellix and Wellbutrin as prescribed Return to clinic in 3-4 months Continue individual therapy I would support the patient if she wishes to work with human resources for a transfer of her position to another nursing position that is more in keeping with her mental health needs, I do believe that her job has contributed to depression  I spent 25 minutes with the patient in direct face-to-face clinical care.  Greater than 50% of this time was spent in counseling and coordination of care with the patient.    Burnard LeighAlexander Arya Derris Millan, MD 07/16/2017, 4:09 PM

## 2017-07-23 ENCOUNTER — Ambulatory Visit (INDEPENDENT_AMBULATORY_CARE_PROVIDER_SITE_OTHER): Payer: 59 | Admitting: Licensed Clinical Social Worker

## 2017-07-23 DIAGNOSIS — F331 Major depressive disorder, recurrent, moderate: Secondary | ICD-10-CM | POA: Diagnosis not present

## 2017-07-24 ENCOUNTER — Encounter (HOSPITAL_COMMUNITY): Payer: Self-pay | Admitting: Licensed Clinical Social Worker

## 2017-07-24 NOTE — Progress Notes (Signed)
   THERAPIST PROGRESS NOTE  Session Time: 4:10-5pm  Participation Level: Active  Behavioral Response: Casual/Alert/anxious  Type of Therapy: individual  Treatment Goals addressed: Depressive symptoms  Interventions: CBT/DBT   Summary: Pt presented for her individual counseling session. Pt discussed her psychiatric symptoms and current life events. Pt feels her symptoms are better and her medications are working well. She met with her psychiatrist, Dr. Daron Offer who did not change any medications. Pt still continues to work on her mental wellness after session: self care, soothing morning music, personal space requests, family boundaries. 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 Discussed her journey since she began at [facility] and what her goals are. Pt vacillates with her decision to stay or transfer to another unit. Will continue to be supportive of her challenges and decisions. Talked with pt about her eating disorder symptoms and have referrals for a therapist. Pt reports she's ok for now. Will continue to monitor these symptoms.        Suicidal/Homicidal: No/without intent/plan  Therapist Response: Evaluated patients current functioning and reviewed progress. Assisted pt processing current life events, job frustration, goals for career, self care.. Assisted pt processing for the management of her stressors.  Plan: Return again in 2 weeks.   Diagnosis:   Axis I: Moderate episode of recurrent major depressive disorder        Sandy Blouch S, LCAS  07/23/17

## 2017-08-06 ENCOUNTER — Ambulatory Visit (HOSPITAL_COMMUNITY): Payer: Self-pay | Admitting: Licensed Clinical Social Worker

## 2017-08-20 ENCOUNTER — Ambulatory Visit (INDEPENDENT_AMBULATORY_CARE_PROVIDER_SITE_OTHER): Payer: 59 | Admitting: Licensed Clinical Social Worker

## 2017-08-20 DIAGNOSIS — F331 Major depressive disorder, recurrent, moderate: Secondary | ICD-10-CM | POA: Diagnosis not present

## 2017-08-21 ENCOUNTER — Encounter (HOSPITAL_COMMUNITY): Payer: Self-pay | Admitting: Licensed Clinical Social Worker
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
## 2017-08-27 ENCOUNTER — Other Ambulatory Visit: Payer: Self-pay

## 2017-08-27 ENCOUNTER — Ambulatory Visit (INDEPENDENT_AMBULATORY_CARE_PROVIDER_SITE_OTHER): Payer: 59 | Admitting: Physician Assistant

## 2017-08-27 ENCOUNTER — Encounter: Payer: Self-pay | Admitting: Physician Assistant

## 2017-08-27 VITALS — BP 110/60 | HR 86 | Temp 98.2°F | Resp 18 | Ht 65.0 in | Wt 129.0 lb

## 2017-08-27 DIAGNOSIS — R5383 Other fatigue: Secondary | ICD-10-CM

## 2017-08-27 DIAGNOSIS — R400 Somnolence: Secondary | ICD-10-CM | POA: Diagnosis not present

## 2017-08-27 DIAGNOSIS — F331 Major depressive disorder, recurrent, moderate: Secondary | ICD-10-CM

## 2017-08-27 DIAGNOSIS — IMO0001 Reserved for inherently not codable concepts without codable children: Secondary | ICD-10-CM

## 2017-08-27 DIAGNOSIS — K5900 Constipation, unspecified: Secondary | ICD-10-CM | POA: Diagnosis not present

## 2017-08-27 DIAGNOSIS — Z114 Encounter for screening for human immunodeficiency virus [HIV]: Secondary | ICD-10-CM

## 2017-08-27 DIAGNOSIS — F411 Generalized anxiety disorder: Secondary | ICD-10-CM | POA: Diagnosis not present

## 2017-08-27 DIAGNOSIS — Z789 Other specified health status: Secondary | ICD-10-CM

## 2017-08-27 MED ORDER — NORETHIN-ETH ESTRAD-FE BIPHAS 1 MG-10 MCG / 10 MCG PO TABS: 1.0000 | ORAL_TABLET | Freq: Every day | ORAL | 3 refills | Status: DC

## 2017-08-27 MED ORDER — LINACLOTIDE 145 MCG PO CAPS: 145.0000 ug | ORAL_CAPSULE | Freq: Every day | ORAL | 1 refills | Status: DC

## 2017-08-27 NOTE — Patient Instructions (Signed)
     IF you received an x-ray today, you will receive an invoice from [facility]. Please contact [facility] at [phone] with questions or concerns regarding your invoice.   IF you received labwork today, you will receive an invoice from [facility]. Please contact [facility] at [phone] with questions or concerns regarding your invoice.   Our billing staff will not be able to assist you with questions regarding bills from these companies.  You will be contacted with the lab results as soon as they are available. The fastest way to get your results is to activate your My Chart account. Instructions are located on the last page of this paperwork. If you have not heard from us regarding the results in 2 weeks, please contact this office.     

## 2017-08-27 NOTE — Progress Notes (Signed)
Brooke Fernandez  MRN: [id number] DOB: 26-May-1985  PCP: Mancel Bale, PA-C  Chief Complaint  Patient presents with  . Establish Care    Subjective:  Pt presents to clinic to establish care.  Constipation issues for as long as she can remember. Not worse with any of her mental health medications.  2 BM a weeks - hard compact stool.  Stool softner do not work. Miralax does not help.  Stimulants have stopped helping as much as they used to.  One weekends she tends to have a slightly better chance of having a BM - she is able to sit and relax and rink several cups of coffee and that will sometimes help.    Tired all the time.  Sleeps well at night - at least 8 hours and she sleeps well - she does snore and she wakes herself up at night with her snoring.  Thyroid levels were checked about 10 years ago.  The fatigue seems to be worse over the last year.  Trintellex was added about 6 months ago and last adjustment was about 1 month ago.  Her mental health is not yet controlled.  She naps several hours after work and then sleeps 8 additional hours at night.  On the weekends she sleeps 12h and that makes her feel refreshed sometimes.  Exercise 1-2 times a week - about an hour.  Drinks water - maybe 32 oz a day Eat - 3 meals a day - home prepared - good amount of veggies  History is obtained by patient.  Review of Systems  Constitutional: Positive for fatigue.  Gastrointestinal: Positive for constipation. Negative for anal bleeding and nausea.  Psychiatric/Behavioral: Positive for dysphoric mood and sleep disturbance (daytime sleepiness). The patient is nervous/anxious.     Patient Active Problem List   Diagnosis Date Noted  . Moderate episode of recurrent major depressive disorder ([place]) 12/27/2015  . Generalized anxiety disorder 12/27/2015  . Chronic idiopathic constipation 05/21/1993    Current Outpatient Medications on File Prior to Visit  Medication Sig Dispense Refill  . b  complex vitamins tablet Take 1 tablet by mouth daily.    . Biotin (BIOTIN MAXIMUM STRENGTH) 10 MG TABS Take by mouth.    Marland Kitchen buPROPion (WELLBUTRIN XL) 300 MG 24 hr tablet Take 1 tablet (300 mg total) by mouth every morning. 90 tablet 1  . Specialty Vitamins Products (ADVANCED COLLAGEN) TABS Take 600 mg by mouth.    . vortioxetine HBr (TRINTELLIX) 20 MG TABS Take 20 mg by mouth daily. 90 tablet 1   No current facility-administered medications on file prior to visit.     No Known Allergies  Past Medical History:  Diagnosis Date  . Anxiety   . Depression    Social History   Social History Narrative   Lives alone.   OR nurse at [employer].   Social History   Tobacco Use  . Smoking status: Never Smoker  . Smokeless tobacco: Never Used  Substance Use Topics  . Alcohol use: Yes    Alcohol/week: 0.0 oz    Comment: 4 glasses of wine a week about 2 nights of drinking  . Drug use: No   family history includes Anxiety disorder in her brother; Breast cancer in her maternal aunt; CAD in her maternal grandmother; Colon cancer in her maternal grandfather; Depression in her brother and mother.     Objective:  BP 110/60   Pulse 86   Temp 98.2 F (36.8 C) (Oral)  Resp 18   Ht _0  (1.651 m)   Wt 129 lb (58.5 kg)   LMP 08/05/2017   SpO2 97%   BMI 21.47 kg/m  Body mass index is 21.47 kg/m.  Physical Exam  Constitutional: She is oriented to person, place, and time and well-developed, well-nourished, and in no distress.  HENT:  Head: Normocephalic and atraumatic.  Right Ear: Hearing and external ear normal.  Left Ear: Hearing and external ear normal.  Eyes: Conjunctivae are normal.  Neck: Normal range of motion. No thyromegaly present.  Cardiovascular: Normal rate, regular rhythm and normal heart sounds.  No murmur heard. Pulmonary/Chest: Effort normal and breath sounds normal. She has no wheezes.  Neurological: She is alert and oriented to person, place, and time. Gait normal.    Skin: Skin is warm and dry.  Psychiatric: Mood, memory, affect and judgment normal.  Vitals reviewed.   Assessment and Plan :  Daytime sleepiness - Plan: Ambulatory referral to Sleep Studies  Fatigue, unspecified type - Plan: CBC with Differential/Platelet, TSH, CMP14+EGFR, T4, Free, Ambulatory referral to Sleep Studies - unsure of cause - check labs, consider depression that is not well controlled but with her snoring and waking up and Epworth scale will send for sleep study.  Pt agrees with this plan and wants to make sure she does not have sleep apnea.  Constipation, unspecified constipation type - Plan: linaclotide (LINZESS) 145 MCG CAPS capsule - could be low water intake but her urine is light yellow- could be mental health mediations - will try linzess and if that does not give relief will send to GI for evaluation  Encounter for screening for HIV - Plan: HIV antibody  Birth control - Plan: Norethindrone-Ethinyl Estradiol-Fe Biphas (LO LOESTRIN FE) 1 MG-10 MCG / 10 MCG tablet - refilled pap UTD  Generalized anxiety disorder - continue with specialist  Moderate episode of recurrent major depressive disorder ([place]) - continue with specialist   Windell Hummingbird PA-C  [facility] 08/27/2017 4:54 PM

## 2017-08-28 LAB — CMP14+EGFR
ALT: 17 IU/L (ref 0–32)
AST: 21 IU/L (ref 0–40)
Albumin/Globulin Ratio: 2.2 (ref 1.2–2.2)
Albumin: 5 g/dL (ref 3.5–5.5)
Alkaline Phosphatase: 69 IU/L (ref 39–117)
BUN/Creatinine Ratio: 28 — ABNORMAL HIGH (ref 9–23)
BUN: 17 mg/dL (ref 6–20)
Bilirubin Total: <0.2 mg/dL (ref 0.0–1.2)
CO2: 21 mmol/L (ref 20–29)
Calcium: 9.8 mg/dL (ref 8.7–10.2)
Chloride: 104 mmol/L (ref 96–106)
Creatinine, Ser: 0.6 mg/dL (ref 0.57–1.00)
GFR calc Af Amer: 142 mL/min/{1.73_m2} (ref >59)
GFR calc non Af Amer: 124 mL/min/{1.73_m2} (ref >59)
Globulin, Total: 2.3 g/dL (ref 1.5–4.5)
Glucose: 72 mg/dL (ref 65–99)
Potassium: 4.5 mmol/L (ref 3.5–5.2)
Sodium: 143 mmol/L (ref 134–144)
Total Protein: 7.3 g/dL (ref 6.0–8.5)

## 2017-08-28 LAB — HIV ANTIBODY (ROUTINE TESTING W REFLEX): HIV Screen 4th Generation wRfx: NONREACTIVE

## 2017-08-28 LAB — CBC WITH DIFFERENTIAL/PLATELET
Basophils Absolute: 0 10*3/uL (ref 0.0–0.2)
Basos: 0 %
EOS (ABSOLUTE): 0 10*3/uL (ref 0.0–0.4)
Eos: 0 %
Hematocrit: 42.9 % (ref 34.0–46.6)
Hemoglobin: 13.8 g/dL (ref 11.1–15.9)
Immature Grans (Abs): 0 10*3/uL (ref 0.0–0.1)
Immature Granulocytes: 0 %
Lymphocytes Absolute: 3.1 10*3/uL (ref 0.7–3.1)
Lymphs: 40 %
MCH: 31.9 pg (ref 26.6–33.0)
MCHC: 32.2 g/dL (ref 31.5–35.7)
MCV: 99 fL — ABNORMAL HIGH (ref 79–97)
Monocytes Absolute: 0.5 10*3/uL (ref 0.1–0.9)
Monocytes: 6 %
Neutrophils Absolute: 4 10*3/uL (ref 1.4–7.0)
Neutrophils: 54 %
Platelets: 305 10*3/uL (ref 150–379)
RBC: 4.33 x10E6/uL (ref 3.77–5.28)
RDW: 13.1 % (ref 12.3–15.4)
WBC: 7.6 10*3/uL (ref 3.4–10.8)

## 2017-08-28 LAB — T4, FREE: Free T4: 1.13 ng/dL (ref 0.82–1.77)

## 2017-08-28 LAB — TSH: TSH: 1.06 u[IU]/mL (ref 0.450–4.500)

## 2017-09-03 ENCOUNTER — Ambulatory Visit (INDEPENDENT_AMBULATORY_CARE_PROVIDER_SITE_OTHER): Payer: 59 | Admitting: Licensed Clinical Social Worker

## 2017-09-03 ENCOUNTER — Encounter (HOSPITAL_COMMUNITY): Payer: Self-pay | Admitting: Licensed Clinical Social Worker

## 2017-09-03 DIAGNOSIS — F331 Major depressive disorder, recurrent, moderate: Secondary | ICD-10-CM

## 2017-09-04 ENCOUNTER — Encounter (HOSPITAL_COMMUNITY): Payer: Self-pay | Admitting: Licensed Clinical Social Worker
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
## 2017-09-06 ENCOUNTER — Encounter: Payer: Self-pay | Admitting: Physician Assistant

## 2017-09-06 DIAGNOSIS — K5900 Constipation, unspecified: Secondary | ICD-10-CM

## 2017-09-09 ENCOUNTER — Encounter: Payer: Self-pay | Admitting: Gastroenterology

## 2017-09-17 ENCOUNTER — Ambulatory Visit (INDEPENDENT_AMBULATORY_CARE_PROVIDER_SITE_OTHER): Payer: 59 | Admitting: Licensed Clinical Social Worker

## 2017-09-17 DIAGNOSIS — F331 Major depressive disorder, recurrent, moderate: Secondary | ICD-10-CM

## 2017-09-17 DIAGNOSIS — Z564 Discord with boss and workmates: Secondary | ICD-10-CM | POA: Diagnosis not present

## 2017-09-18 ENCOUNTER — Encounter (HOSPITAL_COMMUNITY): Payer: Self-pay | Admitting: Licensed Clinical Social Worker

## 2017-09-18 NOTE — Progress Notes (Signed)
   THERAPIST PROGRESS NOTE  Session Time: 4:10-5pm  Participation Level: Active  Behavioral Response: Casual/Alert/Euthymic  Type of Therapy: individual  Treatment Goals addressed: Depressive symptoms  Interventions: CBT/DBT   Summary: Pt presented for her individual counseling session. Pt discussed her psychiatric symptoms and current life events. Pt feels her symptoms are better and her medications are working well. Pt feels her moods are stable. Pt reports her job is currently her major stressor. 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 They are making it difficult for pt at work and she feels bullied. Pt was vulnerable in discussing what bullying felt like in middle school. Asked open ended questions. Pt continues to journal about the ostracizing going on at work. She reports this helps to do when she gets home. Discussed with pt how we always have people in our life that are mean, condescending, and difficult. We don't have a choice, we just have to deal with it. Processed with pt alternatives that she has to talk to someone at a higher level about the problem. Pt shared she and her boyfriend are looking to buy a house. Asked open ended questions about her thoughts and feelings. Pt was able to share how she was dealing with this next step in her relationship. Pt reports her eating disorder symptoms are non existent currently. Will continue to monitor pt.          Suicidal/Homicidal: No/without intent/plan  Therapist Response: Evaluated patients current functioning and reviewed progress. Assisted pt processing current life events,  issues at work, relationship, next steps. Assisted pt processing for the management of her stressors.  Plan: Return again in 2 weeks.   Diagnosis:   Axis I: Moderate episode of recurrent major depressive disorder        MACKENZIE,LISBETH S, LCAS  09/17/17

## 2017-09-25 ENCOUNTER — Ambulatory Visit (INDEPENDENT_AMBULATORY_CARE_PROVIDER_SITE_OTHER): Payer: 59 | Admitting: Licensed Clinical Social Worker

## 2017-09-25 DIAGNOSIS — F331 Major depressive disorder, recurrent, moderate: Secondary | ICD-10-CM

## 2017-09-26 ENCOUNTER — Encounter (HOSPITAL_COMMUNITY): Payer: Self-pay | Admitting: Licensed Clinical Social Worker

## 2017-09-26 NOTE — Progress Notes (Signed)
  THERAPIST PROGRESS NOTE  Session Time: 4:10-5pm  Participation Level: Active  Behavioral Response: Casual/Alert/Euthymic  Type of Therapy: individual  Treatment Goals addressed: Depressive symptoms  Interventions: CBT/DBT   Summary: Pt presented for her individual counseling session. Pt discussed her psychiatric symptoms and current life events. Pt feels her symptoms are better and her medications are working well. Pt feels her moods are more stable. She and her boyfriend have found a house to purchase. She is excited but anxious. Asked open ended questions and used empathic reflection. Discussed her anxiety and what coping skills she she is using as she became anxious in discussing the purchase. Pt uses  Mindfulness activities for anxiety. Suggested to pt to also use journaling as a tool during the purchase to write about her feelings. Pt was in agreement. Pt spoke with her father and he was negative. Role played with pt how to talk to her father. Pt struggles to deal with their relationship but will make no decisions on what exactly to do about their relationship. Pt continues to feel ostracized at work, but uses her skills to deal with the feelings.               Suicidal/Homicidal: No/without intent/plan  Therapist Response: Evaluated patients current functioning and reviewed progress. Assisted pt processing current life events,  issues at work, family  Relationship,moving forward in her life. Assisted pt processing for the management of her stressors.  Plan: Return again in 2 weeks.   Diagnosis:   Axis I: Moderate episode of recurrent major depressive disorder        MACKENZIE,LISBETH S, LCAS  09/25/17

## 2017-10-08 ENCOUNTER — Other Ambulatory Visit: Payer: Self-pay | Admitting: Urgent Care

## 2017-10-08 MED ORDER — CLINDAMYCIN PHOS-BENZOYL PEROX 1-5 % EX GEL: Freq: Two times a day (BID) | CUTANEOUS | 0 refills | Status: DC

## 2017-10-11 ENCOUNTER — Encounter: Payer: Self-pay | Admitting: Physician Assistant

## 2017-10-11 MED ORDER — CEPHALEXIN 500 MG PO CAPS: 500.0000 mg | ORAL_CAPSULE | Freq: Three times a day (TID) | ORAL | 0 refills | Status: AC

## 2017-10-15 ENCOUNTER — Ambulatory Visit (HOSPITAL_COMMUNITY): Payer: Self-pay | Admitting: Psychiatry

## 2017-10-17 ENCOUNTER — Other Ambulatory Visit: Payer: Self-pay | Admitting: Neurology

## 2017-10-17 ENCOUNTER — Encounter: Payer: Self-pay | Admitting: Neurology

## 2017-10-17 ENCOUNTER — Ambulatory Visit (INDEPENDENT_AMBULATORY_CARE_PROVIDER_SITE_OTHER): Payer: 59 | Admitting: Neurology

## 2017-10-17 VITALS — BP 99/68 | HR 74 | Ht 65.0 in | Wt 130.0 lb

## 2017-10-17 DIAGNOSIS — G4719 Other hypersomnia: Secondary | ICD-10-CM | POA: Diagnosis not present

## 2017-10-17 DIAGNOSIS — R0683 Snoring: Secondary | ICD-10-CM

## 2017-10-17 DIAGNOSIS — G4730 Sleep apnea, unspecified: Secondary | ICD-10-CM

## 2017-10-17 DIAGNOSIS — G4711 Idiopathic hypersomnia with long sleep time: Secondary | ICD-10-CM

## 2017-10-17 NOTE — Progress Notes (Signed)
SLEEP MEDICINE CLINIC   Provider:  Larey Seat, M.D.   Primary Care Physician:  Mancel Bale, PA-C   Referring Provider: [facility]   Chief Complaint  Patient presents with  . New Patient (Initial Visit)    pt alone, rm 10. pt states that she gets at least 8 hours of sleep at night and despite that she is still very tired during the day. No sleep study thus far . EDS-She will sometimes take naps of 90 min. duration during the day that " hold her over ". She has been told she snores and stops breathing while asleep.     HPI:  Brooke Fernandez is a 32 y.o. female , seen here  in a referral from [facility] for an evaluation of EDS (excessive daytime sleepiness) confirmed by her, Boyfriend who has noted her to snore and have apnea.  Mrs. Brooke Fernandez is a 32 year old Caucasian female patient who works as an Haematologist.  She works from 7 AM to 3 PM Monday through Friday, but has additional shifts and background call duties.  She endorsed depression, anxiety level of decreased energy snoring and fatigue, she is currently treated with Trental X, she takes some special supplements including biotin and vitamin B complex and she is taking Wellbutrin 300 mg 1 tablet in the morning.  She had in the past sometimes generalized anxiety spells headaches, and had recurrent depressive episodes.  She states that she sleeps well at night she is not aware that apneas awaken her from sleep, but snoring does.  Chief complaint according to patient : " I am always tire unless I can sleep 12 hours ! "    Sleep habits are as follows: She goes to bed at 9.30 PM (the latest), and wakes at 5.30 with an alarm and lots of snooze button hits.  She met her boyfriend about 7 month ago and he was the first to tell her about apnea, aside form snoring. Her bedroom is cool, quiet and dark.  She sleeps on her back, on 1 pillow. Sleeping promptly. She wakes up spontaneously once during each night and  is unsure what triggered the arousals. No nocturia. She reports very vivid, sometimes lucid - they could be real ! But bizarre. She dreams most nights.  She denies, palpitations, clammy , no headaches. Once up and awake she feels soon after lunch sleepy again. She has not fallen asleep at work. Naps are afternoon naps, 60-90 minutes andnot do not interfere with her nighttime sleep.  She averages 7 hours of sleep in addition to daytime naps.  Sleep medical history and family sleep history: Her mother does snore but does not carry a diagnosis of apnea.  She is not sure that any family member suffers from excessive daytime sleepiness.   Social history:   Review of Systems: Out of a complete 14 system review, the patient complains of only the following symptoms, and all other reviewed systems are negative. EDS-Long sleep time, the patient feels best after 10 to 12 hours of sleep.  She also reports vivid dreams sometimes bizarre dreams lucid dreams.  No report of sleep paralysis.  She believes that she dreams during her naps as well as doing nocturnal sleep. She is easily startled but has not experienced cataplexy.  Depression-  Since age [age] , feels currently well treated.   She has not had a mononucleosis check. Considered chronically fatigue.   Epworth score 13-16  ,  Fatigue severity score 54   , depression score- mostly "anxiety "   Social History   Socioeconomic History  . Marital status: Single    Spouse name: Not on file  . Number of children: Not on file  . Years of education: Not on file  . Highest education level: Not on file  Occupational History  . Occupation: OR Optician, dispensing: [employer]  . Financial resource strain: Not on file  . Food insecurity:    Worry: Not on file    Inability: Not on file  . Transportation needs:    Medical: Not on file    Non-medical: Not on file  Tobacco Use  . Smoking status: Never Smoker  . Smokeless tobacco: Never  Used  Substance and Sexual Activity  . Alcohol use: Yes    Alcohol/week: 0.0 oz    Comment: 4 glasses of wine a week about 2 nights of drinking  . Drug use: No  . Sexual activity: Yes    Partners: Male    Birth control/protection: Pill  Lifestyle  . Physical activity:    Days per week: Not on file    Minutes per session: Not on file  . Stress: Not on file  Relationships  . Social connections:    Talks on phone: Not on file    Gets together: Not on file    Attends religious service: Not on file    Active member of club or organization: Not on file    Attends meetings of clubs or organizations: Not on file    Relationship status: Not on file  . Intimate partner violence:    Fear of current or ex partner: Not on file    Emotionally abused: Not on file    Physically abused: Not on file    Forced sexual activity: Not on file  Other Topics Concern  . Not on file  Social History Narrative   Lives alone.   OR nurse at [employer]    Family History  Problem Relation Age of Onset  . CAD Maternal Grandmother   . Colon cancer Maternal Grandfather   . Breast cancer Maternal Aunt   . Depression Mother   . Anxiety disorder Brother   . Depression Brother     Past Medical History:  Diagnosis Date  . Anxiety   . Depression     No past surgical history on file.  Current Outpatient Medications  Medication Sig Dispense Refill  . b complex vitamins tablet Take 1 tablet by mouth daily.    . Biotin (BIOTIN MAXIMUM STRENGTH) 10 MG TABS Take by mouth.    Marland Kitchen buPROPion (WELLBUTRIN XL) 300 MG 24 hr tablet Take 1 tablet (300 mg total) by mouth every morning. 90 tablet 1  . cephALEXin (KEFLEX) 500 MG capsule Take 1 capsule (500 mg total) by mouth 3 (three) times daily for 10 days. 30 capsule 0  . Norethindrone-Ethinyl Estradiol-Fe Biphas (LO LOESTRIN FE) 1 MG-10 MCG / 10 MCG tablet Take 1 tablet by mouth daily. 3 Package 3  . Specialty Vitamins Products (ADVANCED COLLAGEN) TABS Take 600 mg by  mouth.    . vortioxetine HBr (TRINTELLIX) 20 MG TABS Take 20 mg by mouth daily. 90 tablet 1   No current facility-administered medications for this visit.     Allergies as of 10/17/2017  . (No Known Allergies)    Vitals: BP 99/68   Pulse 74   Ht '5\' 5"'$  (1.651 m)  Wt 130 lb (59 kg)   BMI 21.63 kg/m  Last Weight:  Wt Readings from Last 1 Encounters:  10/17/17 130 lb (59 kg)   ZOX:WRUE mass index is 21.63 kg/m.     Last Height:   Ht Readings from Last 1 Encounters:  10/17/17 '5\' 5"'$  (1.651 m)    Physical exam:  General: The patient is awake, alert and appears not in acute distress. The patient is well groomed. Head: Normocephalic, atraumatic. Neck is supple. Mallampati 1-2 ,  neck circumference: 13" . Nasal airflow patent , bruxism. Retrognathia is seen.  Cardiovascular:  Regular rate and rhythm , without  murmurs or carotid bruit, and without distended neck veins. Respiratory: Lungs are clear to auscultation. Skin:  Without evidence of edema, or rash but many tattooes Trunk: BMI is 21. 6 . The patient's posture is erect .  Neurologic exam : The patient is awake and alert, oriented to place and time.   Mood and affect are appropriate.  Cranial nerves: Pupils are equal and briskly reactive to light. Funduscopic exam: Deferred.  Extraocular movements  in vertical and horizontal planes intact and without nystagmus. Visual fields by finger perimetry are intact.Hearing to finger rub intact.  Facial sensation intact to fine touch.  Facial motor strength is symmetric and tongue and uvula move midline. Shoulder shrug was symmetrical.   Motor exam:   Normal tone, muscle bulk and symmetric strength in all extremities. Strong grip strength.  Sensory:  Fine touch, pinprick and vibration were tested in all extremities.  Coordination:  normal without evidence of ataxia, dysmetria or tremor. Gait and station: Patient walks without assistive device .Deep tendon reflexes: in the  upper and  lower extremities are symmetric and intact.   Assessment:  After physical and neurologic examination, review of laboratory studies,  Personal review of imaging studies, reports of other /same  Imaging studies, results of polysomnography and / or neurophysiology testing and pre-existing records as far as provided in visit., my assessment is   1) excessive daytime sleepiness in a patient with moderate retrognathia, and witnessed snoring and apnea.  Based on this presentation alone I would have used a home sleep test to screen for apnea, but she alo presented with : . 2) vivid dreams on antidepressant.  Mrs. smile also reports having vivid dreams almost nightly sometimes very lucid, this is more important as she takes Wellbutrin in the morning and Trintellix both medications can suppress REM sleep but are in her case likely not doing this.  Given that she has a very high fatigue score, Epworth score and vivid dreams I would prefer to actually order an attended sleep study to check if she has a REM sleep-related disorder. 3) excessive need for sleep - can be related to sedating medication, and to underlying depression and/or anxiety.    The patient was advised of the nature of the diagnosed disorder , the treatment options and the  risks for general health and wellness arising from not treating the condition.   I spent more than  50 minutes of face to face time with the patient.  Greater than 50% of time was spent in counseling and coordination of care. We have discussed the diagnosis and differential and I answered the patient's questions.    Plan:  Treatment plan and additional workup :   Attended sleep study with [facility] at [facility]  ,  Review of REM latency, REM proportion and apnea.    Larey Seat, MD 10/17/2017, 3:58 PM  Certified in Neurology by ABPN Certified in [place] by [facility] [address] [place],  [address]

## 2017-10-17 NOTE — Patient Instructions (Signed)

## 2017-10-17 NOTE — Addendum Note (Signed)
Addended by: Melvyn Novas on: 10/17/2017 04:26 PM   Modules accepted: Orders

## 2017-10-22 ENCOUNTER — Encounter (HOSPITAL_COMMUNITY): Payer: Self-pay | Admitting: Psychiatry

## 2017-10-22 ENCOUNTER — Ambulatory Visit (INDEPENDENT_AMBULATORY_CARE_PROVIDER_SITE_OTHER): Payer: 59 | Admitting: Psychiatry

## 2017-10-22 DIAGNOSIS — F411 Generalized anxiety disorder: Secondary | ICD-10-CM | POA: Diagnosis not present

## 2017-10-22 DIAGNOSIS — Z79899 Other long term (current) drug therapy: Secondary | ICD-10-CM

## 2017-10-22 DIAGNOSIS — Z818 Family history of other mental and behavioral disorders: Secondary | ICD-10-CM | POA: Diagnosis not present

## 2017-10-22 DIAGNOSIS — F509 Eating disorder, unspecified: Secondary | ICD-10-CM | POA: Diagnosis not present

## 2017-10-22 DIAGNOSIS — F331 Major depressive disorder, recurrent, moderate: Secondary | ICD-10-CM | POA: Diagnosis not present

## 2017-10-22 DIAGNOSIS — F4522 Body dysmorphic disorder: Secondary | ICD-10-CM | POA: Diagnosis not present

## 2017-10-22 MED ORDER — VORTIOXETINE HBR 20 MG PO TABS: 20.0000 mg | ORAL_TABLET | Freq: Every day | ORAL | 1 refills | Status: DC

## 2017-10-22 MED ORDER — BUPROPION HCL ER (XL) 300 MG PO TB24: 300.0000 mg | ORAL_TABLET | ORAL | 1 refills | Status: DC

## 2017-10-22 NOTE — Progress Notes (Signed)
BH MD/PA/NP OP Progress Note  10/22/2017 4:37 PM Brooke Fernandez  MRN:  [id number]  Chief Complaint: Med management  HPI: Brooke Fernandez presents for medication management.  She reports that overall her self-esteem depression and mood have been stable.  She has had some issues of conflict at work with some of her peers who became upset at her as she was trying to increase her level of training, which would potentially put her in a place where she would surpass her colleagues professionally.  We spent time weighing the risks and benefits of the sorts of changes, and she feels proud of herself or advocating for her career goals.  She reports that things are going very well with her relationship with her boyfriend and they have moved in together into a home that they have bought together.  She anticipates marriage and engagement, kids etc. is in their future and she feels very excited about this.  The relationship is strong.  She denies any suicidal thoughts, and reports that she has been very mindful of eating healthy and making sure to treat her body with kindness.  She continues in therapy although she has had a few therapy cancellations recently, so she is hoping to get back into her routine.  She has been working with neurology and is scheduled for sleep study to investigate etiologies of fatigue.  They are going to test her with a polysomnography to assess for her sleep cycles, apnea, possible hypersomnia or narcolepsy.   I spent time with her expressing my concern about the potential of being on a stimulant given her issues of body dysmorphia, and if this was to be done, it would need to be done quite thoughtfully and carefully.  She was receptive to this, and I encouraged her to be open with her neurologist about her issues of body dysmorphia as well.  Visit Diagnosis:    ICD-10-CM   1. Moderate episode of recurrent major depressive disorder (HCC) F33.1 buPROPion (WELLBUTRIN XL) 300 MG 24 hr tablet   2. Generalized anxiety disorder F41.1 buPROPion (WELLBUTRIN XL) 300 MG 24 hr tablet  3. Eating disorder F50.9 buPROPion (WELLBUTRIN XL) 300 MG 24 hr tablet   Past Psychiatric History: See intake H&P for full details. Reviewed, with no updates at this time.   Past Medical History:  Past Medical History:  Diagnosis Date  . Anxiety   . Depression    No past surgical history on file.  Family Psychiatric History: See intake H&P for full details. Reviewed, with no updates at this time.   Family History:  Family History  Problem Relation Age of Onset  . CAD Maternal Grandmother   . Colon cancer Maternal Grandfather   . Breast cancer Maternal Aunt   . Depression Mother   . Anxiety disorder Brother   . Depression Brother     Social History:  Social History   Socioeconomic History  . Marital status: Single    Spouse name: Not on file  . Number of children: Not on file  . Years of education: Not on file  . Highest education level: Not on file  Occupational History  . Occupation: OR Academic librarian:   Social Needs  . Financial resource strain: Not on file  . Food insecurity:    Worry: Not on file    Inability: Not on file  . Transportation needs:    Medical: Not on file    Non-medical: Not on file  Tobacco Use  .  Smoking status: Never Smoker  . Smokeless tobacco: Never Used  Substance and Sexual Activity  . Alcohol use: Yes    Alcohol/week: 0.0 oz    Comment: 4 glasses of wine a week about 2 nights of drinking  . Drug use: No  . Sexual activity: Yes    Partners: Male    Birth control/protection: Pill  Lifestyle  . Physical activity:    Days per week: Not on file    Minutes per session: Not on file  . Stress: Not on file  Relationships  . Social connections:    Talks on phone: Not on file    Gets together: Not on file    Attends religious service: Not on file    Active member of club or organization: Not on file    Attends meetings of clubs or  organizations: Not on file    Relationship status: Not on file  Other Topics Concern  . Not on file  Social History Narrative   Lives alone.   OR nurse at [employer]    Allergies: No Known Allergies  Metabolic Disorder Labs: No results found for: HGBA1C, MPG No results found for: PROLACTIN Lab Results  Component Value Date   CHOL 158 11/14/2015   TRIG 96 11/14/2015   HDL 74 11/14/2015   CHOLHDL 2.1 11/14/2015   VLDL 19 11/14/2015   LDLCALC 65 11/14/2015   Lab Results  Component Value Date   TSH 1.060 08/27/2017    Therapeutic Level Labs: No results found for: LITHIUM No results found for: VALPROATE No components found for:  CBMZ  Current Medications: Current Outpatient Medications  Medication Sig Dispense Refill  . b complex vitamins tablet Take 1 tablet by mouth daily.    . Biotin (BIOTIN MAXIMUM STRENGTH) 10 MG TABS Take by mouth.    Marland Kitchen buPROPion (WELLBUTRIN XL) 300 MG 24 hr tablet Take 1 tablet (300 mg total) by mouth every morning. 90 tablet 1  . Norethindrone-Ethinyl Estradiol-Fe Biphas (LO LOESTRIN FE) 1 MG-10 MCG / 10 MCG tablet Take 1 tablet by mouth daily. 3 Package 3  . Specialty Vitamins Products (ADVANCED COLLAGEN) TABS Take 600 mg by mouth.    . vortioxetine HBr (TRINTELLIX) 20 MG TABS tablet Take 1 tablet (20 mg total) by mouth daily. 90 tablet 1   No current facility-administered medications for this visit.      Musculoskeletal: Strength & Muscle Tone: within normal limits Gait & Station: normal Patient leans: N/A  Psychiatric Specialty Exam: ROS  Blood pressure 102/68, pulse 77, height 5\' 5"  (1.651 m), weight 129 lb (58.5 kg).Body mass index is 21.47 kg/m.  General Appearance: Casual and Well Groomed  Eye Contact:  Good  Speech:  Clear and Coherent and Normal Rate  Volume:  Normal  Mood:  Euthymic  Affect:  Appropriate and Congruent  Thought Process:  Goal Directed and Descriptions of Associations: Intact  Orientation:  Full (Time, Place, and  Person)  Thought Content: Logical   Suicidal Thoughts:  No  Homicidal Thoughts:  No  Memory:  Immediate;   Good  Judgement:  Good  Insight:  Good  Psychomotor Activity:  Normal  Concentration:  Concentration: Good  Recall:  Good  Fund of Knowledge: Good  Language: Good  Akathisia:  Negative  Handed:  Right  AIMS (if indicated): not done  Assets:  Communication Skills Desire for Improvement Financial Resources/Insurance Housing  ADL's:  Intact  Cognition: WNL  Sleep:  Good   Screenings: GAD-7  [facility]  Total GAD-7 Score  10    PHQ2-9     [facility]  PHQ-2 Total Score  0  1  PHQ-9 Total Score  -  3       Assessment and Plan:  Brooke PoagLauren Fernandez presents with overall mood stability, is working with neurology to investigate etiologies of fatigue.  She continues to struggle on developing a healthy sense of self image, and puts much of her sense of self-esteem and her physicality and exercise routines.  She has been eating regularly which is quite positive.  The relationship she has been in over the past year has been quite healthy and positive for her, and I have seen her have a significant improvement in her sense of emotional security and overall happiness levels.  She seems to be in a healthy state of mind overall.  No acute safety issues or substance abuse.  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  Disclosed to patient that this Clinical research associatewriter is leaving this practice at the end of August 2019, and patients always has the right to choose their provider. Reassured patient that office will work to provide smooth transition of care whether they wish to remain at this office, or to continue with this provider, or seek alternative care options in  community.  They expressed understanding.   1. Moderate episode of recurrent major depressive disorder (HCC)   2. Generalized anxiety disorder   3. Eating disorder     Status of current problems: stable  Labs Ordered: No orders of the defined types were placed in this encounter.   Labs Reviewed: na  Collateral Obtained/Records Reviewed: na  Plan:  Continue Trintellix 20 mg daily Continue Wellbutrin 300 mg extended release daily If the patient has hypersomnia or narcolepsy spectrum illness, I would be cautious about introducing stimulant, she may be okay with Provigil, but I will defer to neurology on interpreting upcoming sleep results follow up in 3 months or sooner if needed  Burnard LeighAlexander Arya Tyisha Cressy, MD 10/22/2017, 4:37 PM

## 2017-10-23 ENCOUNTER — Encounter

## 2017-10-23 ENCOUNTER — Ambulatory Visit (HOSPITAL_COMMUNITY): Payer: Self-pay | Admitting: Licensed Clinical Social Worker

## 2017-11-04 ENCOUNTER — Ambulatory Visit (INDEPENDENT_AMBULATORY_CARE_PROVIDER_SITE_OTHER): Payer: 59 | Admitting: Licensed Clinical Social Worker

## 2017-11-04 ENCOUNTER — Encounter (HOSPITAL_COMMUNITY): Payer: Self-pay | Admitting: Licensed Clinical Social Worker

## 2017-11-04 DIAGNOSIS — F331 Major depressive disorder, recurrent, moderate: Secondary | ICD-10-CM

## 2017-11-04 NOTE — Progress Notes (Signed)
  THERAPIST PROGRESS NOTE  Session Time: 4:10-5pm  Participation Level: Active  Behavioral Response: Casual/Alert/Depressed  Type of Therapy: individual  Treatment Goals addressed: Depressive symptoms  Interventions: CBT/Supportive   Summary: Pt presented for her individual counseling session. Pt discussed her psychiatric symptoms and current life events. Pt feels her symptoms are better and her medications are working well. Pt just saw Dr. Rene KocherEksir, psychiatrist, and no medications were changed. Pt feels her moods are more stable. Pt had a lot to process today as she hasn't been to therapy in over 30 days due to schedule changes. Pt discussed her relationship with her father and made a final decision to terminate any relationship. She started a letter to her father and read it. Pt will continue to work on the letter. Asked open ended questions and used empathic reflection. She also wrote a letter to her stepfather and confronted him about his drinking. Asked open ended questions and use empathic reflection. Pt and her boyfriend are closing on their house Thursday. Processed with pt her feelings about the next step in her life. Pt was vulnerable in the discussion. Validated pt on her feelings. Pt reports things have improved at work and seem to be back to normal.     Suicidal/Homicidal: No/without intent/plan  Therapist Response: Evaluated patients current functioning and reviewed progress. Assisted pt processing current life events,  issues at work, family relationships,moving forward in her life. Assisted pt processing for the management of her stressors.  Plan: Return again in 2 weeks.   Diagnosis:   Axis I: Moderate episode of recurrent major depressive disorder        Brooke Fernandez,Brooke Fernandez, LCAS  11/04/17

## 2017-11-06 ENCOUNTER — Ambulatory Visit (HOSPITAL_COMMUNITY): Payer: Self-pay | Admitting: Licensed Clinical Social Worker

## 2017-11-06 ENCOUNTER — Ambulatory Visit (INDEPENDENT_AMBULATORY_CARE_PROVIDER_SITE_OTHER): Payer: 59 | Admitting: Neurology

## 2017-11-06 DIAGNOSIS — G4711 Idiopathic hypersomnia with long sleep time: Secondary | ICD-10-CM

## 2017-11-06 DIAGNOSIS — G4719 Other hypersomnia: Secondary | ICD-10-CM

## 2017-11-06 DIAGNOSIS — G4730 Sleep apnea, unspecified: Secondary | ICD-10-CM | POA: Diagnosis not present

## 2017-11-06 DIAGNOSIS — R0683 Snoring: Secondary | ICD-10-CM

## 2017-11-08 NOTE — Procedures (Signed)
PATIENT'S NAME:  Brooke Fernandez, Brooke Fernandez DOB:      Jun 03, 1985      MR#:    [id number]     DATE OF RECORDING: 11/06/2017 REFERRING M.D.:  Benny LennertSarah Weber, PA Study Performed:   Baseline Polysomnogram with Parasomnia Montage  HISTORY: Brooke Fernandez is a 32 y.o. female patient, seen here in a referral from PA Weber for an evaluation of EDS (excessive daytime sleepiness). Her Boyfriend has noted her to snore and have apnea. Chief complaint according to patient: " I am always tired unless I can sleep 12 hours ! 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  She works from 7 AM to 3 PM Monday through Friday, but has additional shifts and background call duties. She states that she sleeps well at night she is not aware that apneas awaken her from sleep, but snoring does. No nocturia. She reports very vivid, sometimes lucid - they feel very real yet bizarre. She dreams most nights.  She denies sleep related palpitations, being clammy, and has no headaches. Once up and awake she feels soon sleepy again. She has not fallen asleep at work. Afternoon naps between 60-90 minutes in duration do not interfere with her nighttime sleep.  The patient endorsed the Epworth Sleepiness Scale at 13-16/24 points.   The patient's weight 130 pounds with a height of 65 (inches), resulting in a BMI of 21.7 kg/m2. The patient's neck circumference measured 13 inches.  CURRENT MEDICATIONS: Vitamin B, Biotin, Keflex, LO Loestrin, Advanced Collagen, Trintellix.   PROCEDURE:  This is a multichannel digital polysomnogram utilizing the Somnostar 11.2 system.  Electrodes and sensors were applied and monitored per AASM Specifications.   EEG, EOG, Chin and Limb EMG, were sampled at 200 Hz.  ECG, Snore and Nasal Pressure, Thermal Airflow, Respiratory Effort, CPAP Flow and Pressure, Oximetry was sampled at 50 Hz. Digital video and audio were recorded.      BASELINE STUDY: Lights Out was at 22:26 and Lights On  at 04:58.  Total recording time (TRT) was 393 minutes, with a total sleep time (TST) of 348.5 minutes. The patient's sleep latency was 30 minutes.  REM latency was 160 minutes.  The sleep efficiency was 88.7 %.     SLEEP ARCHITECTURE: WASO (Wake after sleep onset) was 18 minutes.  There were 12.5 minutes in Stage N1, 133.5 minutes Stage N2, 98 minutes Stage N3 and 104.5 minutes in Stage REM.  The percentage of Stage N1 was 3.6%, Stage N2 was 38.3%, Stage N3 was 28.1% and Stage R (REM sleep) was 30.%.  RESPIRATORY ANALYSIS:  There were a total of 19 respiratory events:  7 obstructive apneas, 6 central apneas and 0 mixed apneas with a total of 13 apneas and an apnea index (AI) of 2.2 /hour. There were 6 hypopneas with a hypopnea index of 1. /hour. The patient also had 0 respiratory event related arousals (RERAs).The total APNEA/HYPOPNEA INDEX (AHI) was 3.3/hour and the total RESPIRATORY DISTURBANCE INDEX was 3.3 /hour.  9 events occurred in REM sleep and 13 events in NREM. The REM AHI was 5.2 /hour, versus a non-REM AHI of 2.5/h. The patient spent 220 minutes of total sleep time in the supine position and 129 minutes in non-supine. The supine AHI was 4.4 versus a non-supine AHI of 1.4.  OXYGEN SATURATION & C02:  The Wake baseline 02 saturation was 94%, with the lowest being 90%. Time spent below 89% saturation equaled 0  minutes. The arousals were noted as: 38 were spontaneous, 2 were associated with PLMs, and 4 were associated with respiratory events.  PERIODIC LIMB MOVEMENTS:  The patient had a total of 44 Periodic Limb Movements.  The Periodic Limb Movement (PLM) index was 7.6 and the PLM Arousal index was 0.3/hour.  Audio and video analysis did not show any abnormal or unusual movements, behaviors, phonations or vocalizations. No nocturia. Moderate Snoring was noted. EKG was in keeping with normal sinus rhythm (NSR). Post-study, the patient indicated that sleep was the same as usual.    IMPRESSION:  1. Moderate Snoring 2. Clinically insignificant apnea, AHI less than 5/h. 3. Clinically insignificant PLMs.   RECOMMENDATIONS: There was no sleep disorder identified.    I certify that I have reviewed the entire raw data recording prior to the issuance of this report in accordance with the Standards of Accreditation of the American Academy of Sleep Medicine (AASM)   Melvyn Novas, MD   11-07-2017  Diplomat, American Board of Psychiatry and Neurology  Diplomat, American Board of Sleep Medicine Medical Director, [facility]

## 2017-11-11 ENCOUNTER — Encounter: Payer: Self-pay | Admitting: Physician Assistant

## 2017-11-12 ENCOUNTER — Telehealth: Payer: Self-pay

## 2017-11-12 NOTE — Telephone Encounter (Signed)
-----   Message from Melvyn Novasarmen Dohmeier, MD sent at 11/08/2017  2:17 PM EDT ----- IMPRESSION:  1. Moderate Snoring 2. Clinically insignificant apnea, AHI less than 5/h. 3. Clinically insignificant PLMs.   RECOMMENDATIONS: There was no organic sleep disorder identified that could explain hypersomnia .

## 2017-11-12 NOTE — Telephone Encounter (Signed)
Pt returned my call. I explained her sleep study results of moderate snoring, clinically insignificant sleep apnea and PLMs. Dr. Vickey Hugerohmeier reports that there was no organic sleep disorder identified that can explain pt's hypersomnia. I offered pt an office visit with Dr. Vickey Hugerohmeier to discuss further, but pt declined. Pt asked if I could send the results to Brooke LennertSarah Weber, PA. Pt verbalized understanding of results. Pt had no questions at this time but was encouraged to call back if questions arise.

## 2017-11-12 NOTE — Telephone Encounter (Signed)
I called pt to discuss. No answer, left a message asking her to call me back. 

## 2017-11-19 ENCOUNTER — Ambulatory Visit: Payer: Self-pay | Admitting: Gastroenterology

## 2017-11-20 ENCOUNTER — Ambulatory Visit (INDEPENDENT_AMBULATORY_CARE_PROVIDER_SITE_OTHER): Payer: 59 | Admitting: Licensed Clinical Social Worker

## 2017-11-20 ENCOUNTER — Encounter (HOSPITAL_COMMUNITY): Payer: Self-pay | Admitting: Licensed Clinical Social Worker

## 2017-11-20 DIAGNOSIS — F331 Major depressive disorder, recurrent, moderate: Secondary | ICD-10-CM

## 2017-11-20 NOTE — Progress Notes (Signed)
  THERAPIST PROGRESS NOTE  Session Time: 4:10-5pm  Participation Level: Active  Behavioral Response: Casual/Alert/Anxious  Type of Therapy: individual  Treatment Goals addressed: Depressive symptoms  Interventions: CBT/Supportive   Summary: Pt presented for her individual counseling session. Pt discussed her psychiatric symptoms and current life events. Pt feels her symptoms are better and her medications are working well. Pt has moved into her new home with her boyfriend, they bought a house. Asked open ended questions. Pt discussed family boundaries. Pt is still pondering what to do about the relationship with her father. Disscused options with pt. Discussed pt's feeling about her father. Pt has joined [employer], at the suggestion of her mother. This was concerning since she has an eating disorder. Asked open ended questions. Pt reports she had sleep study with no findings. Discussed reasons behind her continued daytime sleepiness- not enough calorie intake. Pt agreed she would eat more protein during the day to see if this would change her daytime sleepiness. Pt reports she has an appt with Dr. Rene KocherEksir, after he leaves the practice in August.   Suicidal/Homicidal: No/without intent/plan  Therapist Response: Evaluated patients current functioning and reviewed progress. Assisted pt processing current life events,  family relationships, continued daytime sleepiness.  Assisted pt processing for the management of her stressors.  Plan: Return again in 2 weeks.   Diagnosis:   Axis I: Moderate episode of recurrent major depressive disorder        Keandra Medero S, LCAS  11/20/17

## 2017-11-24 ENCOUNTER — Encounter: Payer: Self-pay | Admitting: Physician Assistant

## 2017-11-27 MED ORDER — LINACLOTIDE 290 MCG PO CAPS: 290.0000 ug | ORAL_CAPSULE | Freq: Every day | ORAL | 0 refills | Status: DC

## 2017-12-02 ENCOUNTER — Encounter (HOSPITAL_COMMUNITY): Payer: Self-pay | Admitting: Psychiatry

## 2017-12-02 DIAGNOSIS — G4719 Other hypersomnia: Secondary | ICD-10-CM

## 2017-12-05 MED ORDER — MODAFINIL 100 MG PO TABS: ORAL_TABLET | ORAL | 1 refills | Status: DC

## 2017-12-26 ENCOUNTER — Other Ambulatory Visit: Payer: Self-pay | Admitting: Urgent Care

## 2017-12-26 MED ORDER — RANITIDINE HCL 150 MG PO TABS: 150.0000 mg | ORAL_TABLET | Freq: Two times a day (BID) | ORAL | 0 refills | Status: DC

## 2017-12-26 MED ORDER — PREDNISONE 20 MG PO TABS: ORAL_TABLET | ORAL | 0 refills | Status: DC

## 2017-12-26 MED ORDER — LEVOCETIRIZINE DIHYDROCHLORIDE 5 MG PO TABS: 5.0000 mg | ORAL_TABLET | Freq: Every evening | ORAL | 11 refills | Status: DC

## 2017-12-26 NOTE — Progress Notes (Signed)
Telephone encounter with patient completed. Patient reports worsening pruritic rash over extremities, upper chest, neck. Rash has persisted for ~1 month and has not responded to triamcinolone cream, Zantac and Xyzal. Started out over posterior knee but is worse now as above. Has exposure to poison ivy, poisonous plants.

## 2017-12-31 ENCOUNTER — Encounter: Payer: Self-pay | Admitting: Physician Assistant

## 2018-01-01 ENCOUNTER — Encounter

## 2018-01-01 ENCOUNTER — Ambulatory Visit (INDEPENDENT_AMBULATORY_CARE_PROVIDER_SITE_OTHER): Payer: 59 | Admitting: Licensed Clinical Social Worker

## 2018-01-01 ENCOUNTER — Encounter (HOSPITAL_COMMUNITY): Payer: Self-pay | Admitting: Licensed Clinical Social Worker

## 2018-01-01 DIAGNOSIS — F331 Major depressive disorder, recurrent, moderate: Secondary | ICD-10-CM

## 2018-01-01 NOTE — Progress Notes (Signed)
Comprehensive Clinical Assessment (CCA) Note  01/01/2018 Brooke Fernandez [id number]  Visit Diagnosis:      ICD-10-CM   1. Moderate episode of recurrent major depressive disorder (HCC) F33.1       CCA Part One  Part One has been completed on paper by the patient.  (See scanned document in Chart Review)  CCA Part Two A  Intake/Chief Complaint:  CCA Intake With Chief Complaint CCA Part Two Date: 01/01/18 CCA Part Two Time: 0407 Chief Complaint/Presenting Problem: anxiety and depression and eating disorder Patients Currently Reported Symptoms/Problems: stress, anxiety, depression mostly surrounding work Collateral Involvement: Dr. Unice BaileyEksir's notes Individual's Strengths: educated, supportive parents, job, consciencious Individual's Preferences: prefers outpatient therapy Individual's Abilities: ability to work through issues of anxiety and depression Type of Services Patient Feels Are Needed: outpatient therapy  Mental Health Symptoms Depression:  Depression: Change in energy/activity, Fatigue, Irritability, Sleep (too much or little)  Mania:     Anxiety:   Anxiety: Restlessness, Tension, Irritability  Psychosis:     Trauma:     Obsessions:     Compulsions:     Inattention:     Hyperactivity/Impulsivity:     Oppositional/Defiant Behaviors:     Borderline Personality:     Other Mood/Personality Symptoms:      Mental Status Exam Appearance and self-care  Stature:  Stature: Small  Weight:  Weight: Thin  Clothing:  Clothing: Casual  Grooming:  Grooming: Normal  Cosmetic use:  Cosmetic Use: Age appropriate  Posture/gait:  Posture/Gait: Normal  Motor activity:  Motor Activity: Not Remarkable  Sensorium  Attention:  Attention: Normal  Concentration:  Concentration: Normal  Orientation:  Orientation: X5  Recall/memory:  Recall/Memory: Normal  Affect and Mood  Affect:  Affect: Appropriate  Mood:  Mood: Euthymic  Relating  Eye contact:  Eye Contact: Normal  Facial expression:   Facial Expression: Responsive  Attitude toward examiner:  Attitude Toward Examiner: Cooperative  Thought and Language  Speech flow: Speech Flow: Normal  Thought content:  Thought Content: Appropriate to mood and circumstances  Preoccupation:     Hallucinations:     Organization:     Company secretaryxecutive Functions  Fund of Knowledge:  Fund of Knowledge: Average  Intelligence:  Intelligence: [employer] and [employer]sverage  Abstraction:  Abstraction: Normal  Judgement:  Judgement: Normal  Reality Testing:  Reality Testing: Realistic  Insight:  Insight: Good  Decision Making:  Decision Making: Normal  Social Functioning  Social Maturity:  Social Maturity: Responsible  Social Judgement:  Social Judgement: Normal  Stress  Stressors:  Stressors: Family conflict  Coping Ability:  Coping Ability: Engineer, agriculturalesilient  Skill Deficits:     Supports:      Family and Psychosocial History: Family history Marital status: Single  Childhood History:  Childhood History By whom was/is the patient raised?: Mother, Father, Psychologist, occupationalMother/father and step-parent Additional childhood history information:  parents divorced at age 65, doesn't remember a lot, always had 2 families Description of patient's relationship with caregiver when they were a child: closest to biological mother, estranged from biological father How were you disciplined when you got in trouble as a child/adolescent?: catholic guilt, I was a good kid Did patient suffer any verbal/emotional/physical/sexual abuse as a child?: No Has patient ever been sexually abused/assaulted/raped as an adolescent or adult?: No Witnessed domestic violence?: No Has patient been effected by domestic violence as an adult?: No  CCA Part Two B  Employment/Work Situation: Employment / Work Situation Employment situation: Employed Where is patient currently employed?: [employer], [place]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ancorpMilitary?: No Are There Guns or Other Weapons in Your Home?: Yes Types of Guns/Weapons: boyfriends guns in the home in a locked safe Are These Weapons Safely Secured?: Yes  Education: Education Last Grade Completed: 16 Did Garment/textile technologistYou Graduate From [employer] School?: Yes Did Theme park managerYou Attend College?: Yes What Type of College Degree Do you Have?: Nursing degree What Was Your Major?: nursing Did You Have An Individualized Education Program (IIEP): No Did You Have Any Difficulty At School?: No  Religion: Religion/Spirituality Are You A Religious Person?: No  Leisure/Recreation: Leisure / Recreation Leisure and Hobbies: cooking, drinking wine  Exercise/Diet: Exercise/Diet Do You Exercise?: Yes What Type of Exercise Do You Do?: (treadmill) How Many Times a Week Do You Exercise?: 1-3 times a week Have You Gained or Lost A Significant Amount of Weight in the Past Six Months?: No  CCA Part Two C  Alcohol/Drug Use: Alcohol / Drug Use History of alcohol / drug use?: Yes Longest period of sobriety (when/how long): Pt is drinking less, she is now drinking wine while cooking 2 glass limit                      CCA Part Three  ASAM's:  Six Dimensions of Multidimensional Assessment  Dimension 1:  Acute Intoxication and/or Withdrawal Potential:     Dimension 2:  Biomedical Conditions and Complications:     Dimension 3:  Emotional, Behavioral, or Cognitive Conditions and Complications:     Dimension 4:  Readiness to Change:     Dimension 5:  Relapse, Continued use, or Continued Problem Potential:     Dimension 6:  Recovery/Living Environment:      Substance use Disorder (SUD)    Social Function:  Social Functioning Social Maturity: Responsible Social Judgement: Normal  Stress:  Stress Stressors: Family conflict Coping Ability: Resilient Patient Takes Medications The Way The Doctor Instructed?: Yes Priority Risk: Low Acuity  Risk Assessment- Self-Harm Potential: Risk Assessment  For Self-Harm Potential Thoughts of Self-Harm: No current thoughts Method: No plan Availability of Means: No access/NA  Risk Assessment -Dangerous to Others Potential: Risk Assessment For Dangerous to Others Potential Method: No Plan Availability of Means: No access or NA Intent: Vague intent or NA Notification Required: No need or identified person  DSM5 Diagnoses: Patient Active Problem List   Diagnosis Date Noted  . Snoring 10/17/2017  . Sleep apnea 10/17/2017  . Excessive daytime sleepiness 10/17/2017  . Hypersomnia with long sleep time, idiopathic 10/17/2017  . Moderate episode of recurrent major depressive disorder (HCC) 12/27/2015  . Generalized anxiety disorder 12/27/2015  . Chronic idiopathic constipation 05/21/1993    Patient Centered Plan: Patient is on the following Treatment Plan(s):  Depression and anxiety  Recommendations for Services/Supports/Treatments: Recommendations for Services/Supports/Treatments Recommendations For Services/Supports/Treatments: Individual Therapy, Medication Management  Treatment Plan Summary: OP Treatment Plan Summary: depressive symptoms   Referrals to Alternative Service(s): Referred to Alternative Service(s):   Place:   Date:   Time:    Referred to Alternative Service(s):   Place:   Date:   Time:    Referred to Alternative Service(s):   Place:   Date:   Time:    Referred to Alternative Service(s):   Place:   Date:   Time:     Vernona RiegerMACKENZIE,LISBETH S

## 2018-01-22 ENCOUNTER — Ambulatory Visit (HOSPITAL_COMMUNITY): Payer: Self-pay | Admitting: Licensed Clinical Social Worker

## 2018-01-23 DIAGNOSIS — F3342 Major depressive disorder, recurrent, in full remission: Secondary | ICD-10-CM | POA: Diagnosis not present

## 2018-01-23 DIAGNOSIS — G4711 Idiopathic hypersomnia with long sleep time: Secondary | ICD-10-CM | POA: Diagnosis not present

## 2018-01-23 DIAGNOSIS — F502 Bulimia nervosa: Secondary | ICD-10-CM | POA: Diagnosis not present

## 2018-01-23 DIAGNOSIS — F5111 Primary hypersomnia: Secondary | ICD-10-CM | POA: Diagnosis not present

## 2018-01-28 ENCOUNTER — Ambulatory Visit (HOSPITAL_COMMUNITY): Payer: Self-pay | Admitting: Licensed Clinical Social Worker

## 2018-02-05 ENCOUNTER — Ambulatory Visit (INDEPENDENT_AMBULATORY_CARE_PROVIDER_SITE_OTHER): Payer: 59 | Admitting: Licensed Clinical Social Worker

## 2018-02-05 ENCOUNTER — Encounter (HOSPITAL_COMMUNITY): Payer: Self-pay | Admitting: Licensed Clinical Social Worker

## 2018-02-05 DIAGNOSIS — F331 Major depressive disorder, recurrent, moderate: Secondary | ICD-10-CM | POA: Diagnosis not present

## 2018-02-05 NOTE — Progress Notes (Signed)
  THERAPIST PROGRESS NOTE  Session Time: 4:10-5pm  Participation Level: Active  Behavioral Response: Casual/Alert/Sad  Type of Therapy: individual  Treatment Goals addressed: Improve Psychiatric Symptoms, elevate mood (increased self-esteem, increased self-compassion, increased interaction), improve unhelpful thought patterns, controlled behavior, moderate mood, deliberate speech and thought process(improved social functioning, healthy adjustment to living situation), Learn about diagnosis, healthy coping skills  Interventions: CBT/Supportive   Summary: Pt presented for her individual counseling session. Pt discussed her psychiatric symptoms and current life events. Pt feels her symptoms are better and her medications are working well. Pt met with her psychiatrist, Dr Daron Offer, and Provigil was increased. Her work life hs stabilized. Pt presented sad today. "I am feeling uneasy, everything is so good." Asked open ended questions and used empathic reflection. Processed reasons behind her feelings. Pt also discussed the relationship with her father. Asked open ended questions. Pt desires to cut off a relationship with her father but feels guilty. Processed this with pt. Discussed effective communication styles with pt. She and her boyfriend can struggle with communication.          Suicidal/Homicidal: No/without intent/plan  Therapist Response: Evaluated patients current functioning and reviewed progress. Assisted pt processing current life events,  family relationships, continued daytime sleepiness.  Assisted pt processing for the management of her stressors.  Plan: Return again in 2 weeks. Weight watchers, eating more protein, eating disorder  Diagnosis: Axis I: Moderate episode of recurrent major depressive disorder        MACKENZIE,LISBETH S, LCAS  02/05/18

## 2018-02-19 ENCOUNTER — Encounter (HOSPITAL_COMMUNITY): Payer: Self-pay | Admitting: Licensed Clinical Social Worker

## 2018-02-19 ENCOUNTER — Ambulatory Visit (INDEPENDENT_AMBULATORY_CARE_PROVIDER_SITE_OTHER): Payer: 59 | Admitting: Licensed Clinical Social Worker

## 2018-02-19 DIAGNOSIS — F331 Major depressive disorder, recurrent, moderate: Secondary | ICD-10-CM | POA: Diagnosis not present

## 2018-02-19 NOTE — Progress Notes (Signed)
  THERAPIST PROGRESS NOTE  Session Time: 4:10-5pm  Participation Level: Active  Behavioral Response: Casual/Alert/Depressed  Type of Therapy: individual  Treatment Goals addressed: Improve Psychiatric Symptoms, elevate mood (increased self-esteem, increased self-compassion, increased interaction), improve unhelpful thought patterns, controlled behavior, moderate mood, deliberate speech and thought process(improved social functioning, healthy adjustment to living situation), Learn about diagnosis, healthy coping skills  Interventions: CBT/Supportive   Summary: Pt presented for her individual counseling session. Pt discussed her psychiatric symptoms and current life events. Pt feels her  medications are working well. Pt reports her depressive symptoms have come back because of her job. She is overworked, has little support, very stressed. Asked open ended questions. Discussed options with pt and reminded her that her job has been a big stressor previously. Discussed coping tools that worked previously. Discussed with pt the importance of her self care during this time of immense stress. She is already feeling symptoms of immense stress which will increase. Discussed with pt what coping tools she can use for her self care: Eating healthy, exercising, sleep hygiene, meditation. Pt was in agreement of suggestions.   Suicidal/Homicidal: No/without intent/plan  Therapist Response: Evaluated patients current functioning and reviewed progress. Assisted pt processing current life events,  Work,self care .  Assisted pt processing for the management of her stressors.  Plan: Return again in 2 weeks.    Diagnosis: Axis I: Moderate episode of recurrent major depressive disorder        Kaylor Maiers S, LCAS  02/19/18

## 2018-02-24 DIAGNOSIS — Z01419 Encounter for gynecological examination (general) (routine) without abnormal findings: Secondary | ICD-10-CM | POA: Diagnosis not present

## 2018-02-24 DIAGNOSIS — Z6821 Body mass index (BMI) 21.0-21.9, adult: Secondary | ICD-10-CM | POA: Diagnosis not present

## 2018-03-11 ENCOUNTER — Ambulatory Visit (INDEPENDENT_AMBULATORY_CARE_PROVIDER_SITE_OTHER): Payer: 59 | Admitting: Licensed Clinical Social Worker

## 2018-03-11 DIAGNOSIS — F331 Major depressive disorder, recurrent, moderate: Secondary | ICD-10-CM | POA: Diagnosis not present

## 2018-03-12 ENCOUNTER — Encounter (HOSPITAL_COMMUNITY): Payer: Self-pay | Admitting: Licensed Clinical Social Worker

## 2018-03-12 NOTE — Progress Notes (Signed)
  THERAPIST PROGRESS NOTE  Session Time: 4:10-5pm  Participation Level: Active  Behavioral Response: Casual/Alert/Euthymic  Type of Therapy: individual  Treatment Goals addressed: Improve Psychiatric Symptoms, elevate mood (increased self-esteem, increased self-compassion, increased interaction), improve unhelpful thought patterns, controlled behavior, moderate mood, deliberate speech and thought process(improved social functioning, healthy adjustment to living situation), Learn about diagnosis, healthy coping skills.  Interventions: CBT/Supportive   Summary: Pt presented for her individual counseling session. Pt discussed her psychiatric symptoms and current life events. Pt feels her  medications are working well.Pt excitedly shared she is engaged. She shared the video and story around the engagement. Asked open ended questions. Pt shared the engagement brings up feelings again about the relationship she wants with her father. She is moving towards being unsure of the relationship she wants, from not having a relationship at all. Processed this with pt. Pt does not want to be "given" away at her wedding. "I don't belong to anyone." Processed with pt: "You never belonged to anyone because no one claimed you." Pt continues to struggle at work. 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 Goal for pt. ENcouraged pt to continue to use self care during her struggle at work.Discussed with pt what coping tools she can use for her self care: Eating healthy, exercising, sleep hygiene, meditation. Pt was in agreement of suggestions.   Suicidal/Homicidal: No/without intent/plan  Therapist Response: Evaluated patients current functioning and reviewed progress. Assisted pt processing current life events,  Work,self care .  Assisted pt processing for the management of her stressors.  Plan: Return again in 2 weeks. "You never  belonged to anyone because no one claimed you."   Diagnosis: Axis I: Moderate episode of recurrent major depressive disorder        Loucinda Croy S, LCAS  03/12/18

## 2018-03-24 ENCOUNTER — Ambulatory Visit (INDEPENDENT_AMBULATORY_CARE_PROVIDER_SITE_OTHER): Payer: 59 | Admitting: Licensed Clinical Social Worker

## 2018-03-24 DIAGNOSIS — F331 Major depressive disorder, recurrent, moderate: Secondary | ICD-10-CM | POA: Diagnosis not present

## 2018-03-25 ENCOUNTER — Encounter (HOSPITAL_COMMUNITY): Payer: Self-pay | Admitting: Licensed Clinical Social Worker

## 2018-03-25 NOTE — Progress Notes (Signed)
  THERAPIST PROGRESS NOTE  Session Time: 4:10-5pm  Participation Level: Active  Behavioral Response: Casual/Alert/Depressed/frustrated  Type of Therapy: individual  Treatment Goals addressed: Improve Psychiatric Symptoms, elevate mood (increased self-esteem, increased self-compassion, increased interaction), improve unhelpful thought patterns, controlled behavior, moderate mood, deliberate speech and thought process(improved social functioning, healthy adjustment to living situation), Learn about diagnosis, healthy coping skills.  Interventions: CBT   Summary: Pt presented for her individual counseling session. Pt discussed her psychiatric symptoms and current life events. Pt feels her  medications are working well, but she still experiences anxiety and depression. 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 The morale is getting worse. Discussed her options. Pt still struggles with the relationship with her father, again now that she is getting married. Discussed with pt how her inability to make a decision about her relationship with her father is making matters worse. Processed this with pt. Discussed options for father/daughter relationship. Pt continues to struggle with vulnerability and communication in her relationship. Processed with pt the importance of these for a healthy relationship. Continue to suggest to pt the importance of her self care: Eating healthy, exercising, sleep hygiene, meditation. Pt continues to be in agreement of suggestions.   Suicidal/Homicidal: No/without intent/plan  Therapist Response: Evaluated patients current functioning and reviewed progress. Assisted pt processing current life events,  Work options, vulnerability, communication, father/daughter relationship, self care .  Assisted pt processing for the management of her stressors.  Plan: Return again in 2 weeks. "You never  belonged to anyone because no one claimed you."   Diagnosis: Axis I: Moderate episode of recurrent major depressive disorder        MACKENZIE,LISBETH S, LCAS  03/24/18

## 2018-04-07 ENCOUNTER — Encounter (HOSPITAL_COMMUNITY): Payer: Self-pay | Admitting: Licensed Clinical Social Worker

## 2018-04-07 ENCOUNTER — Ambulatory Visit (INDEPENDENT_AMBULATORY_CARE_PROVIDER_SITE_OTHER): Payer: 59 | Admitting: Licensed Clinical Social Worker

## 2018-04-07 DIAGNOSIS — F331 Major depressive disorder, recurrent, moderate: Secondary | ICD-10-CM

## 2018-04-07 NOTE — Progress Notes (Signed)
  THERAPIST PROGRESS NOTE  Session Time: 4:10-5pm  Participation Level: Active  Behavioral Response: Casual/Alert/Depressed  Type of Therapy: individual  Treatment Goals addressed: Improve Psychiatric Symptoms, elevate mood (increased self-esteem, increased self-compassion, increased interaction), improve unhelpful thought patterns, controlled behavior, moderate mood, deliberate speech and thought process(improved social functioning, healthy adjustment to living situation), Learn about diagnosis, healthy coping skills.  Interventions: CBT   Summary: Pt presented for her individual counseling session. Pt discussed her psychiatric symptoms and current life events. Pt presents depressed today. I have not seen her this depressed since when she first entered therapy. Work is and has always been her biggest stressor. Work continues to be more stressful. Worked with pt to determine her best options so that she can maintain her strategies and goals she has planned. Pt continues to be more tearful and has no energy. She gets up and goes to work and comes home. Discussed the option of FMLA, STD and IOP. Discussed this at length. Continue to remind pt she has to work on her self care.    Suicidal/Homicidal: No/without intent/plan  Therapist Response: Evaluated patients current functioning and reviewed progress. Assisted pt processing current life events,  Work options, self care .  Assisted pt processing for the management of her stressors.  Plan: Return again in 2 weeks. "You never belonged to anyone because no one claimed you."   Diagnosis: Axis I: Moderate episode of recurrent major depressive disorder        Brit Carbonell S, LCAS  04/07/18

## 2018-04-11 ENCOUNTER — Emergency Department (HOSPITAL_COMMUNITY)
Admission: EM | Admit: 2018-04-11 | Discharge: 2018-04-11 | Disposition: A | Discharge status: Home / Self Care | Payer: PRIVATE HEALTH INSURANCE | Attending: Emergency Medicine | Admitting: Emergency Medicine

## 2018-04-11 DIAGNOSIS — Z79899 Other long term (current) drug therapy: Secondary | ICD-10-CM | POA: Diagnosis not present

## 2018-04-11 DIAGNOSIS — W2209XA Striking against other stationary object, initial encounter: Secondary | ICD-10-CM | POA: Diagnosis not present

## 2018-04-11 DIAGNOSIS — Y929 Unspecified place or not applicable: Secondary | ICD-10-CM | POA: Insufficient documentation

## 2018-04-11 DIAGNOSIS — Y999 Unspecified external cause status: Secondary | ICD-10-CM | POA: Insufficient documentation

## 2018-04-11 DIAGNOSIS — Y939 Activity, unspecified: Secondary | ICD-10-CM | POA: Insufficient documentation

## 2018-04-11 DIAGNOSIS — S060X0A Concussion without loss of consciousness, initial encounter: Secondary | ICD-10-CM | POA: Insufficient documentation

## 2018-04-11 NOTE — ED Notes (Signed)
ED Provider at bedside. 

## 2018-04-11 NOTE — ED Provider Notes (Signed)
[facility] EMERGENCY DEPARTMENT Provider Note   CSN: [id number] Arrival date & time: 04/11/18  8295     History   Chief Complaint Chief Complaint  Patient presents with  . hit head    HPI Brooke Fernandez is a 32 y.o. female.  She struck her head on a monitor in the operating room 8:40 AM today.  She did not fall to the ground.  She "saw stars" immediately for several minutes and since the event she feels as if her right ear feels "warm" and she feels as if she is not thinking clearly.  A nurse companion who accompanied her to the emergency department states patient became briefly "vagal" however she did not lose consciousness currently she complains of a mild headache at right parietal area which is nonradiating, and is at the site where she struck her head.  Eyesight is now normal.  She denies hearing changes.  Denies neck pain no other complaint.  Nothing makes symptoms better or worse.  No other associated symptoms  HPI  Past Medical History:  Diagnosis Date  . Anxiety   . Depression     Patient Active Problem List   Diagnosis Date Noted  . Snoring 10/17/2017  . Sleep apnea 10/17/2017  . Excessive daytime sleepiness 10/17/2017  . Hypersomnia with long sleep time, idiopathic 10/17/2017  . Moderate episode of recurrent major depressive disorder (HCC) 12/27/2015  . Generalized anxiety disorder 12/27/2015  . Chronic idiopathic constipation 05/21/1993    No past surgical history on file.   OB History   None      Home Medications    Prior to Admission medications   Medication Sig Start Date End Date Taking? Authorizing Provider  b complex vitamins tablet Take 1 tablet by mouth daily.    [provider]  Biotin (BIOTIN MAXIMUM STRENGTH) 10 MG TABS Take by mouth.    [provider]  buPROPion (WELLBUTRIN XL) 300 MG 24 hr tablet Take 1 tablet (300 mg total) by mouth every morning. 10/22/17 10/22/18  Burnard Leigh, MD    levocetirizine (XYZAL) 5 MG tablet Take 1 tablet (5 mg total) by mouth every evening. 12/26/17   Wallis Bamberg, PA-C  linaclotide [facility]) 290 MCG CAPS capsule Take 1 capsule (290 mcg total) by mouth daily before breakfast. 11/27/17   Valarie Cones, Dema Severin, PA-C  modafinil (PROVIGIL) 100 MG tablet Take 0.5 tablets (50 mg total) by mouth daily for 7 days, THEN 1 tablet (100 mg total) daily. 12/05/17 02/10/18  Burnard Leigh, MD  Norethindrone-Ethinyl Estradiol-Fe Biphas (LO LOESTRIN FE) 1 MG-10 MCG / 10 MCG tablet Take 1 tablet by mouth daily. 08/27/17   Weber, Dema Severin, PA-C  predniSONE (DELTASONE) 20 MG tablet Day 1-5: Take 3 tablets daily. Day 6-10: Take 2 tablets daily. Day 11-15: Take 1 tablet daily. Take tablets with breakfast. 12/26/17   Wallis Bamberg, PA-C  ranitidine (ZANTAC) 150 MG tablet Take 1 tablet (150 mg total) by mouth 2 (two) times daily. 12/26/17   Wallis Bamberg, PA-C  Specialty Vitamins Products (ADVANCED COLLAGEN) TABS Take 600 mg by mouth.    [provider]  vortioxetine HBr (TRINTELLIX) 20 MG TABS tablet Take 1 tablet (20 mg total) by mouth daily. 10/22/17   Eksir, Bo Mcclintock, MD    Family History Family History  Problem Relation Age of Onset  . CAD Maternal Grandmother   . Colon cancer Maternal Grandfather   . Breast cancer Maternal Aunt   . Depression Mother   .  Anxiety disorder Brother   . Depression Brother     Social History Social History   Tobacco Use  . Smoking status: Never Smoker  . Smokeless tobacco: Never Used  Substance Use Topics  . Alcohol use: Yes    Alcohol/week: 0.0 standard drinks    Comment: 4 glasses of wine a week about 2 nights of drinking  . Drug use: No     Allergies   Patient has no known allergies.   Review of Systems Review of Systems  Constitutional: Negative.   HENT: Negative.   Eyes: Positive for visual disturbance.       Briefly "saw stars"  Respiratory: Negative.   Cardiovascular: Negative.   Gastrointestinal:  Negative.   Musculoskeletal: Negative.   Skin: Negative.   Neurological: Positive for headaches.  Psychiatric/Behavioral: Negative.      Physical Exam Updated Vital Signs BP 122/78 (BP Location: Right Arm)   Pulse 80   Temp 98.2 F (36.8 C) (Oral)   Resp 16   Ht $RemoveBefor eDEID_ocKCKsKsgNagQeNSlXCmLPhxCxeyeoDb$5\' 4"%   BMI 21.46 kg/m   Physical Exam  Constitutional: She is oriented to person, place, and time. She appears well-developed and well-nourished.  HENT:  Head: Normocephalic and atraumatic.  Right Ear: External ear normal.  Left Ear: External ear normal.  Mouth/Throat: Oropharynx is clear and moist.  No scalp hematoma.  Bilateral TMs normal  Eyes: Pupils are equal, round, and reactive to light. Conjunctivae are normal.  Neck: Neck supple. No tracheal deviation present. No thyromegaly present.  No tenderness  Cardiovascular: Normal rate and regular rhythm.  No murmur heard. Pulmonary/Chest: Effort normal and breath sounds normal.  Abdominal: Soft. Bowel sounds are normal. She exhibits no distension. There is no tenderness.  Musculoskeletal: Normal range of motion. She exhibits no edema or tenderness.  Neurological: She is alert and oriented to person, place, and time. Coordination normal.  Gait normal Romberg normal pronator drift normal finger-to-nose normal DTRs symmetric bilaterally at knee jerk ankle jerk and biceps.  Skin: Skin is warm and dry. No rash noted.  Psychiatric: She has a normal mood and affect.  Nursing note and vitals reviewed.    ED Treatments / Results  Labs (all labs ordered are listed, but only abnormal results are displayed) Labs Reviewed - No data to display  EKG None  Radiology No results found.  Procedures Procedures (including critical care time)  Medications Ordered in ED Medications - No data to display   Initial Impression / Assessment and Plan / ED Course  I have reviewed the triage vital signs and the nursing  notes.  Pertinent labs & imaging results that were available during my care of the patient were reviewed by me and considered in my medical decision making (see chart for details).     Patient suffered mild concussion as result of impact with a monitor.  I do not feel that she needs brain imaging.  Discussed with patient and mother.  They are in agreement I have advised patient not to be alone for the next 24 hours..  Referral to [employer] as needed.  Return precautions given for vomiting, behavioral changes, or at patient's or mother's concern.  She will be staying with her mother for the next 24 hours.  Final Clinical Impressions(s) / ED Diagnoses   Final diagnoses:  Concussion without loss of consciousness, initial encounter    ED Discharge Orders  None       Doug Sou, MD 04/11/18 1028

## 2018-04-11 NOTE — ED Triage Notes (Addendum)
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 Nurses who witnessed stated she had a small vagal response and became diaphoretic C/o nausea at this time. Not on thinners. Denies LOC. A/O x4 and VSS at this time.

## 2018-04-11 NOTE — ED Notes (Signed)
Patient ambulatory to bathroom with steady gait at this time with assistance. Denies lightheadedness and dizziness.

## 2018-04-11 NOTE — Discharge Instructions (Signed)
It is okay to take Tylenol as needed for pain.  See your work comp clinic if you do not feel that you are able to return to work full-time by Monday, 04/14/2018

## 2018-04-11 NOTE — ED Notes (Signed)
Patient verbalizes understanding of discharge instructions. Opportunity for questioning and answers were provided. Armband removed by staff, pt discharged from ED.  

## 2018-04-22 ENCOUNTER — Encounter (HOSPITAL_COMMUNITY): Payer: Self-pay | Admitting: Licensed Clinical Social Worker

## 2018-04-22 ENCOUNTER — Ambulatory Visit (INDEPENDENT_AMBULATORY_CARE_PROVIDER_SITE_OTHER): Payer: 59 | Admitting: Licensed Clinical Social Worker

## 2018-04-22 DIAGNOSIS — F331 Major depressive disorder, recurrent, moderate: Secondary | ICD-10-CM | POA: Diagnosis not present

## 2018-04-22 NOTE — Progress Notes (Addendum)
  THERAPIST PROGRESS NOTE  Session Time: 4:10-5pm  Participation Level: Active  Behavioral Response: Casual/Alert/Depressed  Type of Therapy: individual  Treatment Goals addressed: Improve Psychiatric Symptoms, elevate mood (increased self-esteem, increased self-compassion, increased interaction), improve unhelpful thought patterns, controlled behavior, moderate mood, deliberate speech and thought process(improved social functioning, healthy adjustment to living situation), Learn about diagnosis, healthy coping skills.  Interventions: CBT   Summary: Pt presented for her individual counseling session. Pt discussed her psychiatric symptoms and current life events. Pt presents depressed today, pt continues on her downward spiral of depression. Work continues to be her biggest stressor. [place]. She is looking forward to the training and having a break from her job. Last week pt was hurt at work and suffered a concussion. She is still exhibiting symptoms of concussion. Suggested she seek medical attention again at Urgent Care.  Pt continues to be more tearful and goes through the motions to get through her work day. Pt is interested in FMLA, STD and IOP. Processed options with pt. Pt has appt with Dr. Rene KocherEksir the week of 12/16. Suggested she call him prior to appt to discuss her continued downward spiral of depression.  Suicidal/Homicidal: No/without intent/plan  Therapist Response: Evaluated patients current functioning and reviewed progress. Assisted pt processing her biggest stressor of work,  Work options, FMLA, STD, IOP, medication, psychiatrist, self care .  Assisted pt processing for the management of her stressors.  Plan: Return again in 2 weeks. Resentments with brother, FMLA, concussion, meds   Diagnosis: Axis I: Moderate episode of recurrent major depressive disorder        Kamani Lewter S, LCAS  04/22/18

## 2018-04-24 DIAGNOSIS — G4711 Idiopathic hypersomnia with long sleep time: Secondary | ICD-10-CM | POA: Diagnosis not present

## 2018-04-24 DIAGNOSIS — F3342 Major depressive disorder, recurrent, in full remission: Secondary | ICD-10-CM | POA: Diagnosis not present

## 2018-04-24 DIAGNOSIS — F332 Major depressive disorder, recurrent severe without psychotic features: Secondary | ICD-10-CM | POA: Diagnosis not present

## 2018-04-24 DIAGNOSIS — F5111 Primary hypersomnia: Secondary | ICD-10-CM | POA: Diagnosis not present

## 2018-04-24 DIAGNOSIS — F502 Bulimia nervosa: Secondary | ICD-10-CM | POA: Diagnosis not present

## 2018-05-06 ENCOUNTER — Ambulatory Visit (HOSPITAL_COMMUNITY): Payer: 59 | Admitting: Licensed Clinical Social Worker

## 2018-05-06 DIAGNOSIS — F332 Major depressive disorder, recurrent severe without psychotic features: Secondary | ICD-10-CM | POA: Diagnosis not present

## 2018-05-07 ENCOUNTER — Ambulatory Visit (INDEPENDENT_AMBULATORY_CARE_PROVIDER_SITE_OTHER): Payer: 59 | Admitting: Licensed Clinical Social Worker

## 2018-05-07 DIAGNOSIS — F332 Major depressive disorder, recurrent severe without psychotic features: Secondary | ICD-10-CM | POA: Diagnosis not present

## 2018-05-07 DIAGNOSIS — F331 Major depressive disorder, recurrent, moderate: Secondary | ICD-10-CM

## 2018-05-08 ENCOUNTER — Encounter (HOSPITAL_COMMUNITY): Payer: Self-pay | Admitting: Licensed Clinical Social Worker

## 2018-05-08 NOTE — Progress Notes (Signed)
  THERAPIST PROGRESS NOTE  Session Time: 5:30-6:20pm  Participation Level: Active  Behavioral Response: Casual/Alert/Depressed  Type of Therapy: individual  Treatment Goals addressed: Improve Psychiatric Symptoms, elevate mood (increased self-esteem, increased self-compassion, increased interaction), improve unhelpful thought patterns, controlled behavior, moderate mood, deliberate speech and thought process(improved social functioning, healthy adjustment to living situation), Learn about diagnosis, healthy coping skills.  Interventions: CBT   Summary: Pt presented for her individual counseling session. Pt discussed her psychiatric symptoms and current life events. Pt presents depressed today.  Pt saw her psychiatrist Dr. Rene KocherEksir at my urging to discuss her meds. He Started her on TMS. Discussed this at length with pt as she is having 2nd thoughts about her decision due to the way it is making her feel after 2  Treatments. Her fiance is also not happy with her decision to begin the TMS treatments. [place] last week. She is excited about her new learned skills which has changed her outlook at work. 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 Pt shared the arguments she and her fiance continusly have. Asked open ended questions and used empathic reflection. Role played effective communication skills with pt.    Suicidal/Homicidal: No/without intent/plan  Therapist Response: Evaluated patients current functioning and reviewed progress. Assisted pt processing current life events,  Work, Manufacturing systems engineercommunication skills, [employer].  Assisted pt processing for the management of her stressors.  Plan: Return again in 2 weeks. "You never belonged to anyone because no one claimed you."   Diagnosis: Axis I: Moderate episode of recurrent major depressive disorder        Brooke Fernandez S, LCAS  05/08/18

## 2018-05-28 ENCOUNTER — Ambulatory Visit (INDEPENDENT_AMBULATORY_CARE_PROVIDER_SITE_OTHER): Payer: 59 | Admitting: Licensed Clinical Social Worker

## 2018-05-28 DIAGNOSIS — F331 Major depressive disorder, recurrent, moderate: Secondary | ICD-10-CM | POA: Diagnosis not present

## 2018-05-29 ENCOUNTER — Encounter (HOSPITAL_COMMUNITY): Payer: Self-pay | Admitting: Licensed Clinical Social Worker

## 2018-05-29 NOTE — Progress Notes (Signed)
  THERAPIST PROGRESS NOTE  Session Time: 4:40-5:30pm  Participation Level: Active  Behavioral Response: Casual/Alert/Depressed  Type of Therapy: individual  Treatment Goals addressed: Improve Psychiatric Symptoms, elevate mood (increased self-esteem, increased self-compassion, increased interaction), improve unhelpful thought patterns, controlled behavior, moderate mood, deliberate speech and thought process(improved social functioning, healthy adjustment to living situation), Learn about diagnosis, healthy coping skills.  Interventions: CBT/TX Plan update   Summary: Pt presented for her individual counseling session. Pt discussed her psychiatric symptoms and current life events. Pt presents mildly depressed today.  She reports her moods have improved and she stopped the Wellbutrin and TMS.  She shared during the holidays she was mildly depressed, not wanting to celebrate. Her family did not understand. Role played boundaries with her family (mother, father, step father, 1/2 brother). She was frustrated that she had to explain her depression over the holidays to her family. Processed this with pt. Pt has set a wedding date! She was excited and shared all the details. She wants to make the wedding simple but her mother may try to take over the planning. Again, discussed boundaries with pt. Pt reports, Work is great! 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 She loves the work and ignores her co-workers mean comments.   Suicidal/Homicidal: No/without intent/plan  Therapist Response: Assessed patients current functioning and reviewed progress. Assisted pt processing current life events,  Work, depressive symptoms, TMS, wedding plans, boundaries.  Assisted pt processing for the management of her stressors.  Plan: Return again in 2 weeks.    Diagnosis: Axis I: Moderate episode of recurrent major depressive disorder        Johny Pitstick S,  LCAS  05/29/2018

## 2018-06-01 ENCOUNTER — Other Ambulatory Visit (HOSPITAL_COMMUNITY): Payer: Self-pay | Admitting: Urgent Care

## 2018-06-01 MED ORDER — SILVER SULFADIAZINE 1 % EX CREA: 1.0000 "application " | TOPICAL_CREAM | Freq: Two times a day (BID) | CUTANEOUS | 0 refills | Status: DC

## 2018-06-01 NOTE — Progress Notes (Signed)
Patient reports suffering a burn to her right forearm from making contact with her oven by accident. Reports worsening redness, pain, slight swelling. Has tried Aquaphor with minimal relief. Will send for Silvadene cream for superficial burn.

## 2018-06-09 ENCOUNTER — Encounter (HOSPITAL_COMMUNITY): Payer: Self-pay | Admitting: Licensed Clinical Social Worker

## 2018-06-09 ENCOUNTER — Ambulatory Visit (INDEPENDENT_AMBULATORY_CARE_PROVIDER_SITE_OTHER): Payer: 59 | Admitting: Licensed Clinical Social Worker

## 2018-06-09 DIAGNOSIS — F331 Major depressive disorder, recurrent, moderate: Secondary | ICD-10-CM

## 2018-06-09 NOTE — Progress Notes (Signed)
  THERAPIST PROGRESS NOTE  Session Time: 4:10-5:00pm  Participation Level: Active  Behavioral Response: Casual/Alert/Depressed  Type of Therapy: individual  Treatment Goals addressed: Improve Psychiatric Symptoms, elevate mood (increased self-esteem, increased self-compassion, increased interaction), improve unhelpful thought patterns, controlled behavior, moderate mood, deliberate speech and thought process(improved social functioning, healthy adjustment to living situation), Learn about diagnosis, healthy coping skills.  Interventions: CBT   Summary: Pt presented for her individual counseling session. Pt discussed her psychiatric symptoms and current life events. Pt presents depressed. She is in the midst of wedding planning. She reports it has gone smoothly so far. She had a conversation with her father who made her feel guilty for keeping the wedding small and trying to give her $. He then texted an insightful text which softened patient's feelings. Asked open ended questions about her feelings. Pt and her fiance are struggling with sexual issues. It has been on-going.  Processed her feelings about sex and her lack of conversations with her fiance. The problem stems from her continued body image issues. Asked again about referring pt to ED specialist. Pt reports she is not ready to deal with the body image issues. I expressed concern that I cannot guide her in this issue. 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 Asked open ended questions and used empathic reflection.     Suicidal/Homicidal: No/without intent/plan  Therapist Response: Assessed patients current functioning and reviewed progress. Assisted pt processing current life events,  Work, sex issues, Programme researcher, broadcasting/film/video, wedding plans, boundaries.  Assisted pt processing for the management of her stressors.  Plan: Return again in 2 weeks. ED referral, conversations with fiance, body image,  conversation with father   Diagnosis: Axis I: Moderate episode of recurrent major depressive disorder        MACKENZIE,LISBETH S, LCAS  06/09/2018

## 2018-06-23 ENCOUNTER — Ambulatory Visit (HOSPITAL_COMMUNITY): Payer: 59 | Admitting: Licensed Clinical Social Worker

## 2018-06-24 ENCOUNTER — Encounter (HOSPITAL_COMMUNITY): Payer: Self-pay | Admitting: Licensed Clinical Social Worker

## 2018-06-24 ENCOUNTER — Ambulatory Visit (INDEPENDENT_AMBULATORY_CARE_PROVIDER_SITE_OTHER): Payer: 59 | Admitting: Licensed Clinical Social Worker

## 2018-06-24 DIAGNOSIS — F331 Major depressive disorder, recurrent, moderate: Secondary | ICD-10-CM

## 2018-06-24 NOTE — Progress Notes (Signed)
  THERAPIST PROGRESS NOTE  Session Time: 4:10-5:00pm  Participation Level: Active  Behavioral Response: Casual/Alert/Depressed  Type of Therapy: individual  Treatment Goals addressed: Improve Psychiatric Symptoms, elevate mood (increased self-esteem, increased self-compassion, increased interaction), improve unhelpful thought patterns, controlled behavior, moderate mood, deliberate speech and thought process(improved social functioning, healthy adjustment to living situation), Learn about diagnosis, healthy coping skills.  Interventions: CBT   Summary: Pt presented for her individual counseling session. Pt discussed her psychiatric symptoms and current life events. Pt presents depressed. She and her fiance are having disagreements about their intimacy. Discussed intimacy with pt, asking open ended questions. Asked open ended questions about her introduction to sex. Suggested to pt the importance of putting her relationship first over her job, which seems to be most important to her. Asked how this would feel to the pt. She is tired of arguing about sex often with her fiance, and understands the importance of their relationship. Discussed work options with pt, options to her work stressors. Pt continues with her lack of self care: not sleeping enough, not bathing daily, not eating well. Suggested to pt these are signs of depression. Pt may contact her psychiatrist Dr. Rene Kocher to discuss her symptoms.     Suicidal/Homicidal: No/without intent/plan  Therapist Response: Assessed patients current functioning and reviewed progress. Assisted pt processing current life events,  Work, sex issues, body image, lack of self care, depressive symptoms.  Assisted pt processing for the management of her stressors.  Plan: Return again in 2 weeks. ED referral, conversations with fiance, body image, conversation with father   Diagnosis: Axis I: Moderate episode of recurrent major depressive  disorder        Shaolin Armas S, LCAS  06/24/2018

## 2018-07-04 DIAGNOSIS — F603 Borderline personality disorder: Secondary | ICD-10-CM | POA: Diagnosis not present

## 2018-07-04 DIAGNOSIS — F332 Major depressive disorder, recurrent severe without psychotic features: Secondary | ICD-10-CM | POA: Diagnosis not present

## 2018-07-07 ENCOUNTER — Ambulatory Visit (HOSPITAL_COMMUNITY): Payer: 59 | Admitting: Licensed Clinical Social Worker

## 2018-07-16 ENCOUNTER — Ambulatory Visit (INDEPENDENT_AMBULATORY_CARE_PROVIDER_SITE_OTHER): Payer: 59 | Admitting: Licensed Clinical Social Worker

## 2018-07-16 DIAGNOSIS — F331 Major depressive disorder, recurrent, moderate: Secondary | ICD-10-CM | POA: Diagnosis not present

## 2018-07-17 ENCOUNTER — Encounter (HOSPITAL_COMMUNITY): Payer: Self-pay | Admitting: Licensed Clinical Social Worker

## 2018-07-17 NOTE — Progress Notes (Signed)
  THERAPIST PROGRESS NOTE  Session Time: 4:40--5:30pm  Participation Level: Active  Behavioral Response: Casual/Alert/Depressed  Type of Therapy: individual  Treatment Goals addressed: Improve Psychiatric Symptoms, elevate mood (increased self-esteem, increased self-compassion, increased interaction), improve unhelpful thought patterns, controlled behavior, moderate mood, deliberate speech and thought process(improved social functioning, healthy adjustment to living situation), Learn about diagnosis, healthy coping skills.  Interventions: CBT   Summary: Pt presented for her individual counseling session. Pt discussed her psychiatric symptoms and current life events. Pt presents depressed. She saw her psychiatrist Dr. Rene Kocher for medication management and he has now dx her with Borderline Personality Disorder and has referred her for DBT at [facility]. Discussed the diagnosis with pt by reviewing the DSM V.  Agreed with referral for DBT at [facility]. Pt was concerned about leaving therapy here at [facility]. Discussed discharge and will continue discussion of discharge at next session, which will be her last session.    Suicidal/Homicidal: No/without intent/plan  Therapist Response: Assessed patients current functioning and reviewed progress. Assisted pt processing new dx given by psychiatrist and his referral for DBT at [facility]. Discussed discharge and last session will be next week.  Plan: Discharge at next session.   Diagnosis: Axis I: Moderate episode of recurrent major depressive disorder        MACKENZIE,LISBETH S, LCAS  06/24/2018

## 2018-07-21 ENCOUNTER — Encounter (HOSPITAL_COMMUNITY): Payer: Self-pay | Admitting: Licensed Clinical Social Worker

## 2018-07-21 ENCOUNTER — Ambulatory Visit (INDEPENDENT_AMBULATORY_CARE_PROVIDER_SITE_OTHER): Payer: 59 | Admitting: Licensed Clinical Social Worker

## 2018-07-21 DIAGNOSIS — F331 Major depressive disorder, recurrent, moderate: Secondary | ICD-10-CM

## 2018-07-21 NOTE — Progress Notes (Signed)
  THERAPIST PROGRESS NOTE  Session Time: 4:40--5:30pm  Participation Level: Active  Behavioral Response: Casual/Alert/Depressed  Type of Therapy: individual  Treatment Goals addressed: Improve Psychiatric Symptoms, elevate mood (increased self-esteem, increased self-compassion, increased interaction), improve unhelpful thought patterns, controlled behavior, moderate mood, deliberate speech and thought process(improved social functioning, healthy adjustment to living situation), Learn about diagnosis, healthy coping skills.  Interventions: CBT   Summary: Pt presented for her individual counseling session. Pt discussed her psychiatric symptoms and current life events. Pt presents depressed. Today is her last day in therapy. She has been referred by her psychiatrist for DBT because of her new diagnosis of borderline personality disorder. Discussed goals reached, next steps in her life, asking for help.  Suicidal/Homicidal: No/without intent/plan  Therapist Response: Assessed patients current functioning and reviewed progress. Assisted pt processing new dx given by psychiatrist and his referral for DBT at [facility], discharge and future plans.    Plan: Discharge   Diagnosis: Axis I: Moderate episode of recurrent major depressive disorder        MACKENZIE,LISBETH S, LCAS  07/21/2018

## 2018-07-24 DIAGNOSIS — F411 Generalized anxiety disorder: Secondary | ICD-10-CM | POA: Diagnosis not present

## 2018-07-24 DIAGNOSIS — F331 Major depressive disorder, recurrent, moderate: Secondary | ICD-10-CM | POA: Diagnosis not present

## 2018-08-04 DIAGNOSIS — F331 Major depressive disorder, recurrent, moderate: Secondary | ICD-10-CM | POA: Diagnosis not present

## 2018-08-04 DIAGNOSIS — F411 Generalized anxiety disorder: Secondary | ICD-10-CM | POA: Diagnosis not present

## 2018-08-11 ENCOUNTER — Ambulatory Visit (HOSPITAL_COMMUNITY): Payer: 59 | Admitting: Licensed Clinical Social Worker

## 2018-08-11 DIAGNOSIS — F411 Generalized anxiety disorder: Secondary | ICD-10-CM | POA: Diagnosis not present

## 2018-08-11 DIAGNOSIS — F331 Major depressive disorder, recurrent, moderate: Secondary | ICD-10-CM | POA: Diagnosis not present

## 2018-08-18 ENCOUNTER — Ambulatory Visit (HOSPITAL_COMMUNITY): Payer: 59 | Admitting: Psychiatry

## 2018-08-18 DIAGNOSIS — F331 Major depressive disorder, recurrent, moderate: Secondary | ICD-10-CM | POA: Diagnosis not present

## 2018-08-18 DIAGNOSIS — F411 Generalized anxiety disorder: Secondary | ICD-10-CM | POA: Diagnosis not present

## 2018-08-19 DIAGNOSIS — F330 Major depressive disorder, recurrent, mild: Secondary | ICD-10-CM | POA: Diagnosis not present

## 2018-08-21 DIAGNOSIS — F330 Major depressive disorder, recurrent, mild: Secondary | ICD-10-CM | POA: Diagnosis not present

## 2018-08-25 DIAGNOSIS — F411 Generalized anxiety disorder: Secondary | ICD-10-CM | POA: Diagnosis not present

## 2018-08-25 DIAGNOSIS — F331 Major depressive disorder, recurrent, moderate: Secondary | ICD-10-CM | POA: Diagnosis not present

## 2018-09-01 DIAGNOSIS — F411 Generalized anxiety disorder: Secondary | ICD-10-CM | POA: Diagnosis not present

## 2018-09-01 DIAGNOSIS — F331 Major depressive disorder, recurrent, moderate: Secondary | ICD-10-CM | POA: Diagnosis not present

## 2018-09-08 DIAGNOSIS — F331 Major depressive disorder, recurrent, moderate: Secondary | ICD-10-CM | POA: Diagnosis not present

## 2018-09-08 DIAGNOSIS — F411 Generalized anxiety disorder: Secondary | ICD-10-CM | POA: Diagnosis not present

## 2018-09-15 DIAGNOSIS — F331 Major depressive disorder, recurrent, moderate: Secondary | ICD-10-CM | POA: Diagnosis not present

## 2018-09-15 DIAGNOSIS — F411 Generalized anxiety disorder: Secondary | ICD-10-CM | POA: Diagnosis not present

## 2018-09-17 ENCOUNTER — Other Ambulatory Visit (HOSPITAL_COMMUNITY): Payer: Self-pay | Admitting: Urgent Care

## 2018-09-17 MED ORDER — TRIAMCINOLONE ACETONIDE 0.1 % EX CREA: 1.0000 "application " | TOPICAL_CREAM | Freq: Two times a day (BID) | CUTANEOUS | 0 refills | Status: DC

## 2018-09-17 NOTE — Progress Notes (Signed)
Patient is requesting refill of triamcinolone cream due to recurrent poison ivy rash.

## 2018-09-22 DIAGNOSIS — F411 Generalized anxiety disorder: Secondary | ICD-10-CM | POA: Diagnosis not present

## 2018-09-22 DIAGNOSIS — F331 Major depressive disorder, recurrent, moderate: Secondary | ICD-10-CM | POA: Diagnosis not present

## 2018-09-29 DIAGNOSIS — F331 Major depressive disorder, recurrent, moderate: Secondary | ICD-10-CM | POA: Diagnosis not present

## 2018-09-29 DIAGNOSIS — F411 Generalized anxiety disorder: Secondary | ICD-10-CM | POA: Diagnosis not present

## 2018-10-06 DIAGNOSIS — F411 Generalized anxiety disorder: Secondary | ICD-10-CM | POA: Diagnosis not present

## 2018-10-06 DIAGNOSIS — F331 Major depressive disorder, recurrent, moderate: Secondary | ICD-10-CM | POA: Diagnosis not present

## 2018-11-17 DIAGNOSIS — F330 Major depressive disorder, recurrent, mild: Secondary | ICD-10-CM | POA: Diagnosis not present

## 2019-01-19 DIAGNOSIS — F331 Major depressive disorder, recurrent, moderate: Secondary | ICD-10-CM | POA: Diagnosis not present

## 2019-01-19 DIAGNOSIS — K5904 Chronic idiopathic constipation: Secondary | ICD-10-CM | POA: Diagnosis not present

## 2019-01-19 DIAGNOSIS — F411 Generalized anxiety disorder: Secondary | ICD-10-CM | POA: Diagnosis not present

## 2019-01-19 DIAGNOSIS — F5001 Anorexia nervosa, restricting type: Secondary | ICD-10-CM | POA: Diagnosis not present

## 2019-02-10 DIAGNOSIS — F330 Major depressive disorder, recurrent, mild: Secondary | ICD-10-CM | POA: Diagnosis not present

## 2019-02-19 ENCOUNTER — Telehealth (HOSPITAL_COMMUNITY): Payer: Self-pay | Admitting: Urgent Care

## 2019-02-19 MED ORDER — PREDNISONE 10 MG PO TABS: 30.0000 mg | ORAL_TABLET | Freq: Every day | ORAL | 0 refills | Status: DC

## 2019-02-19 NOTE — Telephone Encounter (Signed)
Reported bilateral ear inflammation, allergic reaction not responding to antibiotic, topical steroid, oral antihistamine. Will use prednisone course.

## 2019-05-04 DIAGNOSIS — Z6821 Body mass index (BMI) 21.0-21.9, adult: Secondary | ICD-10-CM | POA: Diagnosis not present

## 2019-05-04 DIAGNOSIS — Z23 Encounter for immunization: Secondary | ICD-10-CM | POA: Diagnosis not present

## 2019-05-04 DIAGNOSIS — Z3200 Encounter for pregnancy test, result unknown: Secondary | ICD-10-CM | POA: Diagnosis not present

## 2019-05-04 DIAGNOSIS — Z01419 Encounter for gynecological examination (general) (routine) without abnormal findings: Secondary | ICD-10-CM | POA: Diagnosis not present

## 2019-05-18 DIAGNOSIS — F330 Major depressive disorder, recurrent, mild: Secondary | ICD-10-CM | POA: Diagnosis not present

## 2019-05-19 ENCOUNTER — Encounter: Payer: Self-pay | Admitting: Gastroenterology

## 2019-05-20 ENCOUNTER — Telehealth: Payer: Self-pay

## 2019-05-20 NOTE — Telephone Encounter (Signed)
I have left her a detailed message to call me back with which date will work for her.

## 2019-05-20 NOTE — Telephone Encounter (Signed)
Pt returned your call.  

## 2019-05-20 NOTE — Telephone Encounter (Signed)
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

## 2019-05-20 NOTE — Telephone Encounter (Signed)
Patient called back and we booked her for 03/28/2020 at 11:40AM.

## 2019-05-29 ENCOUNTER — Ambulatory Visit (INDEPENDENT_AMBULATORY_CARE_PROVIDER_SITE_OTHER): Payer: 59 | Admitting: Internal Medicine

## 2019-05-29 ENCOUNTER — Encounter: Payer: Self-pay | Admitting: Internal Medicine

## 2019-05-29 ENCOUNTER — Other Ambulatory Visit: Payer: Self-pay

## 2019-05-29 ENCOUNTER — Ambulatory Visit: Payer: 59 | Admitting: Gastroenterology

## 2019-05-29 VITALS — BP 108/70 | HR 70 | Temp 97.6°F | Ht 64.0 in | Wt 133.0 lb

## 2019-05-29 DIAGNOSIS — K5904 Chronic idiopathic constipation: Secondary | ICD-10-CM | POA: Diagnosis not present

## 2019-05-29 NOTE — Assessment & Plan Note (Addendum)
Anorectal function seems relatively intact.  So outlet disorder seems less likely but possible.  Need to look for colonic inertia issues.  She ingested a Sitzmarks today and will plan for a KUB in about 5 days.  Bear in mind that she is trying to conceive and that could affect further work-up and treatment.  She is continuing to have menstrual periods and does not think she is pregnant today Depending upon what the Sitzmarks study shows pelvic floor physical therapy might be appropriate.  She does not have genitourinary issues and does not fit the typical profile of patients with that problem but it remains a possibility i.e. pelvic floor issues.  Anorectal manometry as a possible option as well though I am not inclined to pursue that at this point and would reserve that for refractory issues. Question if history of eating disorders has any bearing upon this not really explored today but but review of PCP note indicates that she tends towards a paleo diet and restrictive eating some which could have some impact here.  She is actively engaged in working on this with a therapist and was seeking a Data processing manager.

## 2019-05-29 NOTE — Progress Notes (Signed)
Brooke Fernandez 34 y.o. March 24, 1986 [id number]  Assessment & Plan:   Chronic idiopathic constipation Anorectal function seems relatively intact.  So outlet disorder seems less likely but possible.  Need to look for colonic inertia issues.  She ingested a Sitzmarks today and will plan for a KUB in about 5 days.  Bear in mind that she is trying to conceive and that could affect further work-up and treatment.  She is continuing to have menstrual periods and does not think she is pregnant today Depending upon what the Sitzmarks study shows pelvic floor physical therapy might be appropriate.  She does not have genitourinary issues and does not fit the typical profile of patients with that problem but it remains a possibility i.e. pelvic floor issues.  Anorectal manometry as a possible option as well though I am not inclined to pursue that at this point and would reserve that for refractory issues. Question if history of eating disorders has any bearing upon this not really explored today but but review of PCP note indicates that she tends towards a paleo diet and restrictive eating some which could have some impact here.  She is actively engaged in working on this with a therapist and was seeking a Microbiologist.  I appreciate the opportunity to care for this patient.   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  She has been coming off some medication some antidepressants but not adding any.  She was screened and is negative for celiac disease by her primary care provider Brooke Shepherd, PA-C and her TSH is normal as well.  The only thing that seems to be working these days is using an enema about once a week.  She may move her bowels about twice a week.  She gets bloated and distended.  It is very  uncomfortable to defecate and she will have large stools.  Is not having bleeding or problems like that.  Linzess gave her liquid stools and she did defecate but somebody told her that she was just "going around a hard stool".  She stopped that.  She is tried MiraLAX docusate tea with senna fiber and MiraLAX together and none of that has really made a difference.  No prior colonoscopy or GI work-up.  She is trying to conceive at this time.  GI review of systems is otherwise negative. No Known Allergies Current Meds  Medication Sig  . Prenatal MV & Min w/FA-DHA (PRENATAL ADULT GUMMY/DHA/FA) 0.4-25 MG CHEW Chew by mouth.   Past Medical History:  Diagnosis Date  . Anorexia nervosa, restricting type   . Anxiety   . Bulimia   . Depression   . Elevated cholesterol    History reviewed. No pertinent surgical history. 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   Review  of Systems Positive for hypersomnia fatigue, some shoulder and neck pain and anxiety all other review of systems negative or as per HPI  Objective:   Physical Exam @BP  108/70   Pulse 70   Temp 97.6 F (36.4 C)   Ht 5\' 4"  (1.626 m)   Wt 133 lb (60.3 kg)   BMI 22.83 kg/m @  General:  Well-developed, well-nourished and in no acute distress Eyes:  anicteric. ENT:   Mouth and posterior pharynx free of lesions.  Neck:   supple w/o thyromegaly or mass.  Lungs: Clear to auscultation bilaterally. Heart:  S1S2, no rubs,  murmurs, gallops. Abdomen:  soft, non-tender, no hepatosplenomegaly, hernia, or mass and BS+.  Rectal:   , CMA present   Lymph:  no cervical or supraclavicular adenopathy. Extremities:   no edema, cyanosis or clubbing Skin   no rash. Neuro:  A&O x 3.  Psych:  appropriate mood and  Affect.   Data Reviewed: Labs from January 19, 2019 and primary care note from that same date are reviewed  Tissue transglutaminase antibody negative as was TSH chemistries urinalysis CBC and hCG

## 2019-05-29 NOTE — Patient Instructions (Signed)
Today you took a pill for a sitz marker test. Please come back to our office in 5 days for an abdominal xray. Go to the basement level and go to the radiology department. We will call you with the results.   Due to recent changes in healthcare laws, you may see the results of your imaging and laboratory studies on MyChart before your provider has had a chance to review them.  We understand that in some cases there may be results that are confusing or concerning to you. Not all laboratory results come back in the same time frame and the provider may be waiting for multiple results in order to interpret others.  Please give [place] 48 hours in order for your provider to thoroughly review all the results before contacting the office for clarification of your results.   Thank you for trusting me with your gastrointestinal care!    Stan Head, MD

## 2019-06-02 ENCOUNTER — Other Ambulatory Visit: Payer: Self-pay

## 2019-06-02 ENCOUNTER — Ambulatory Visit (INDEPENDENT_AMBULATORY_CARE_PROVIDER_SITE_OTHER)
Admission: RE | Admit: 2019-06-02 | Discharge: 2019-06-02 | Disposition: A | Discharge status: Home / Self Care | Payer: 59 | Source: Ambulatory Visit | Attending: Internal Medicine | Admitting: Internal Medicine

## 2019-06-02 DIAGNOSIS — K5900 Constipation, unspecified: Secondary | ICD-10-CM | POA: Diagnosis not present

## 2019-06-02 DIAGNOSIS — K5904 Chronic idiopathic constipation: Secondary | ICD-10-CM | POA: Diagnosis not present

## 2019-06-12 ENCOUNTER — Other Ambulatory Visit: Payer: Self-pay | Admitting: *Deleted

## 2019-06-12 DIAGNOSIS — K5904 Chronic idiopathic constipation: Secondary | ICD-10-CM

## 2019-06-24 ENCOUNTER — Ambulatory Visit: Payer: 59 | Admitting: Physical Therapy

## 2019-06-26 DIAGNOSIS — Z3A01 Less than 8 weeks gestation of pregnancy: Secondary | ICD-10-CM | POA: Diagnosis not present

## 2019-06-26 DIAGNOSIS — Z3689 Encounter for other specified antenatal screening: Secondary | ICD-10-CM | POA: Diagnosis not present

## 2019-06-26 DIAGNOSIS — O209 Hemorrhage in early pregnancy, unspecified: Secondary | ICD-10-CM | POA: Diagnosis not present

## 2019-06-26 DIAGNOSIS — Z3200 Encounter for pregnancy test, result unknown: Secondary | ICD-10-CM | POA: Diagnosis not present

## 2019-06-29 DIAGNOSIS — O209 Hemorrhage in early pregnancy, unspecified: Secondary | ICD-10-CM | POA: Diagnosis not present

## 2019-06-29 DIAGNOSIS — Z3A01 Less than 8 weeks gestation of pregnancy: Secondary | ICD-10-CM | POA: Diagnosis not present

## 2019-08-16 IMAGING — DX DG ANKLE COMPLETE 3+V*L*
3 series · 3 of 3 positions shown · non-contrast
Comparison: None.

CLINICAL DATA: Patient states that she fell when dog took off on
leash yesterday, pain in the left ankle, unable to flex the foot. No
priors

EXAM:
LEFT ANKLE COMPLETE - 3+ VIEW

[ankle ap]
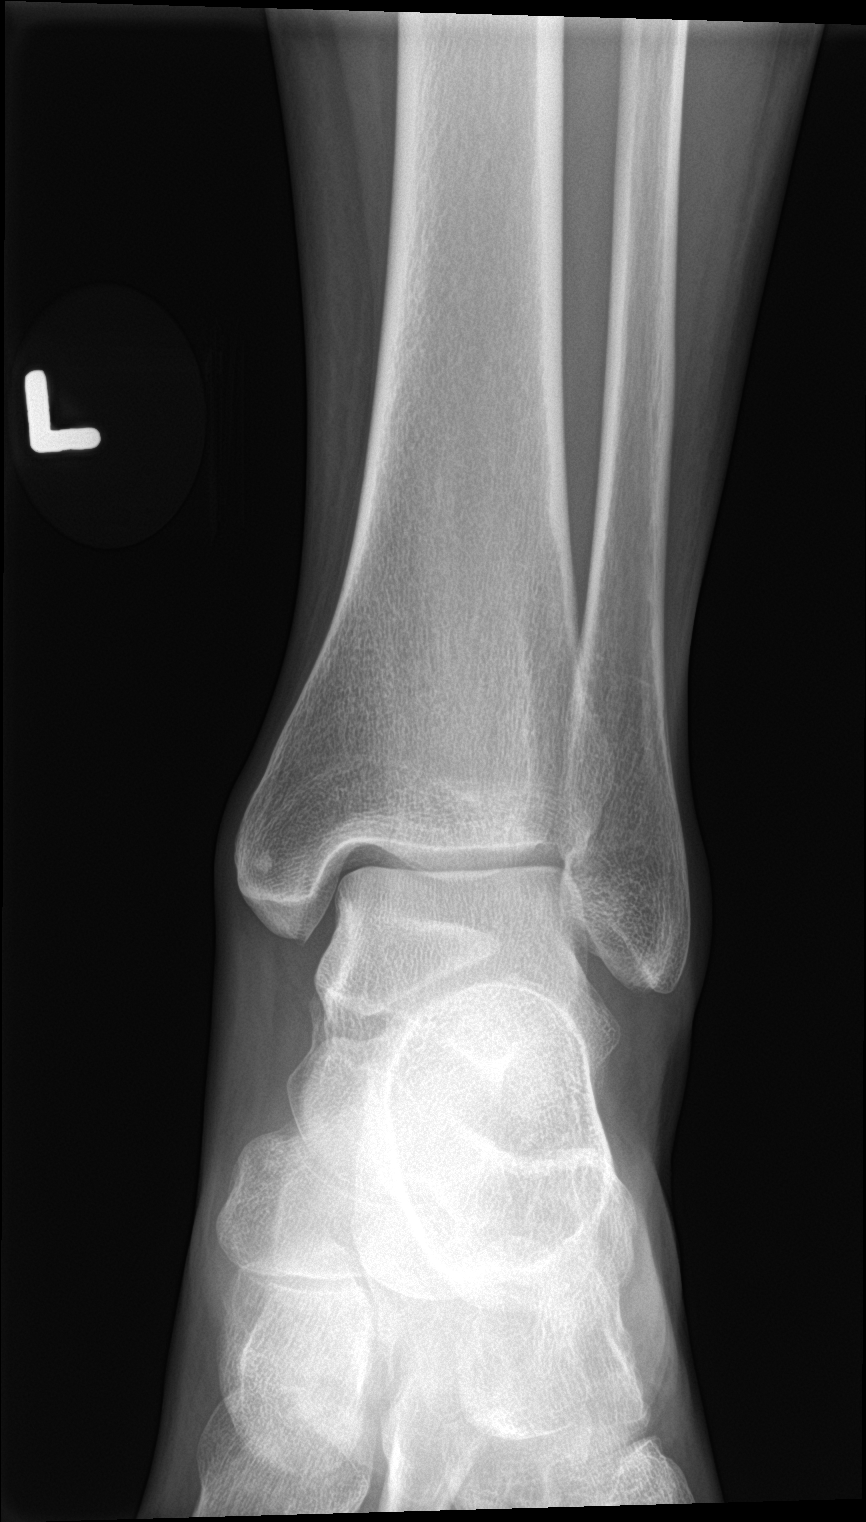

[ankle obl]
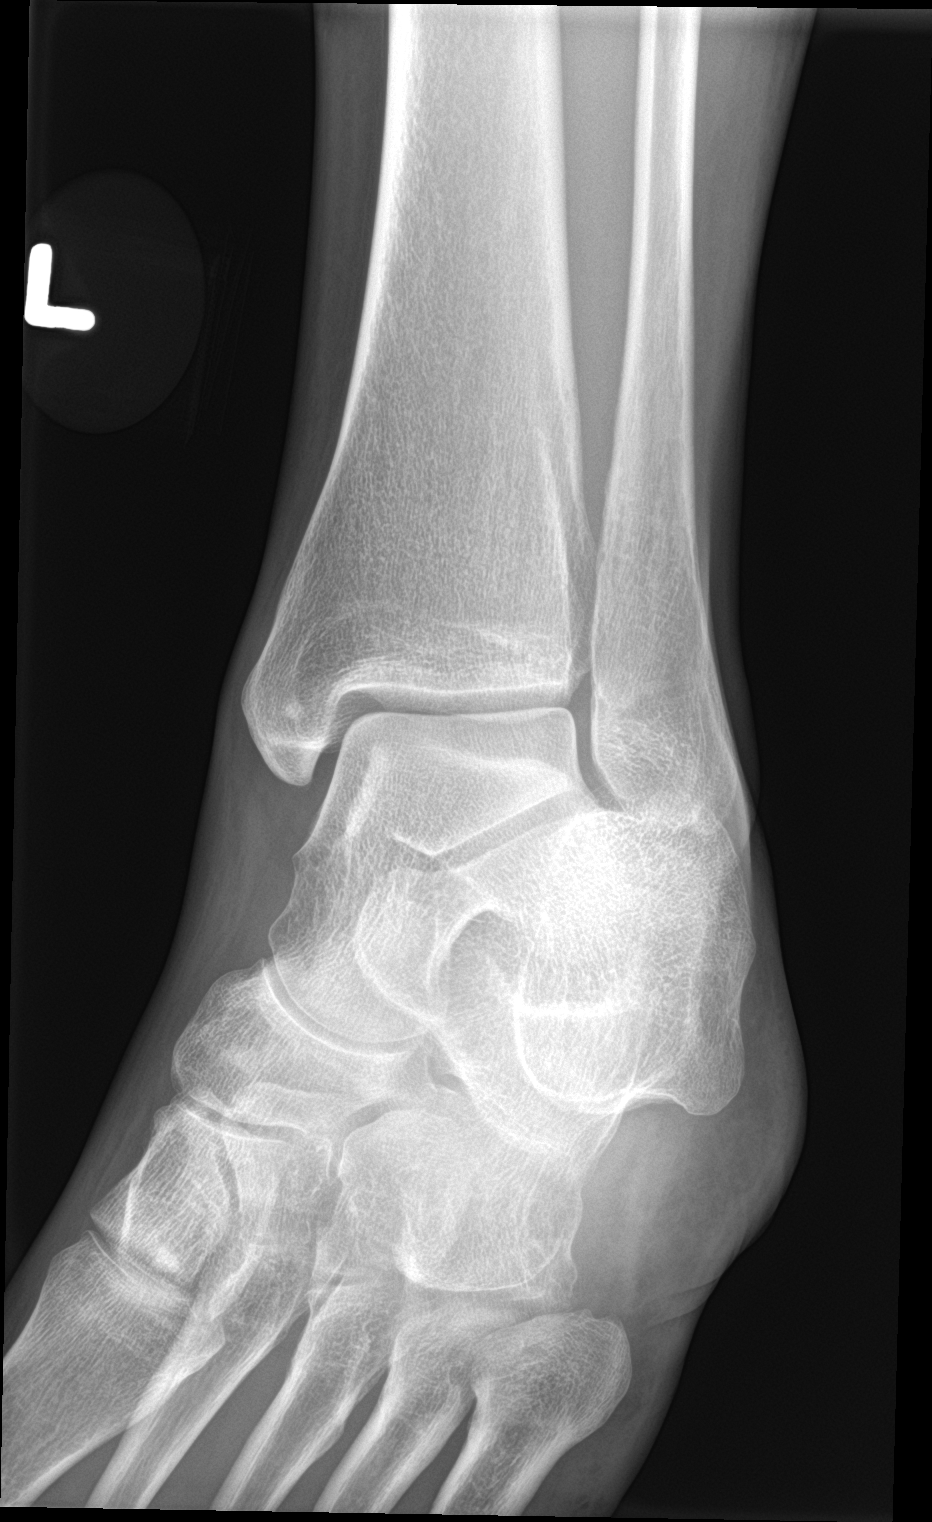

[ankle lat]
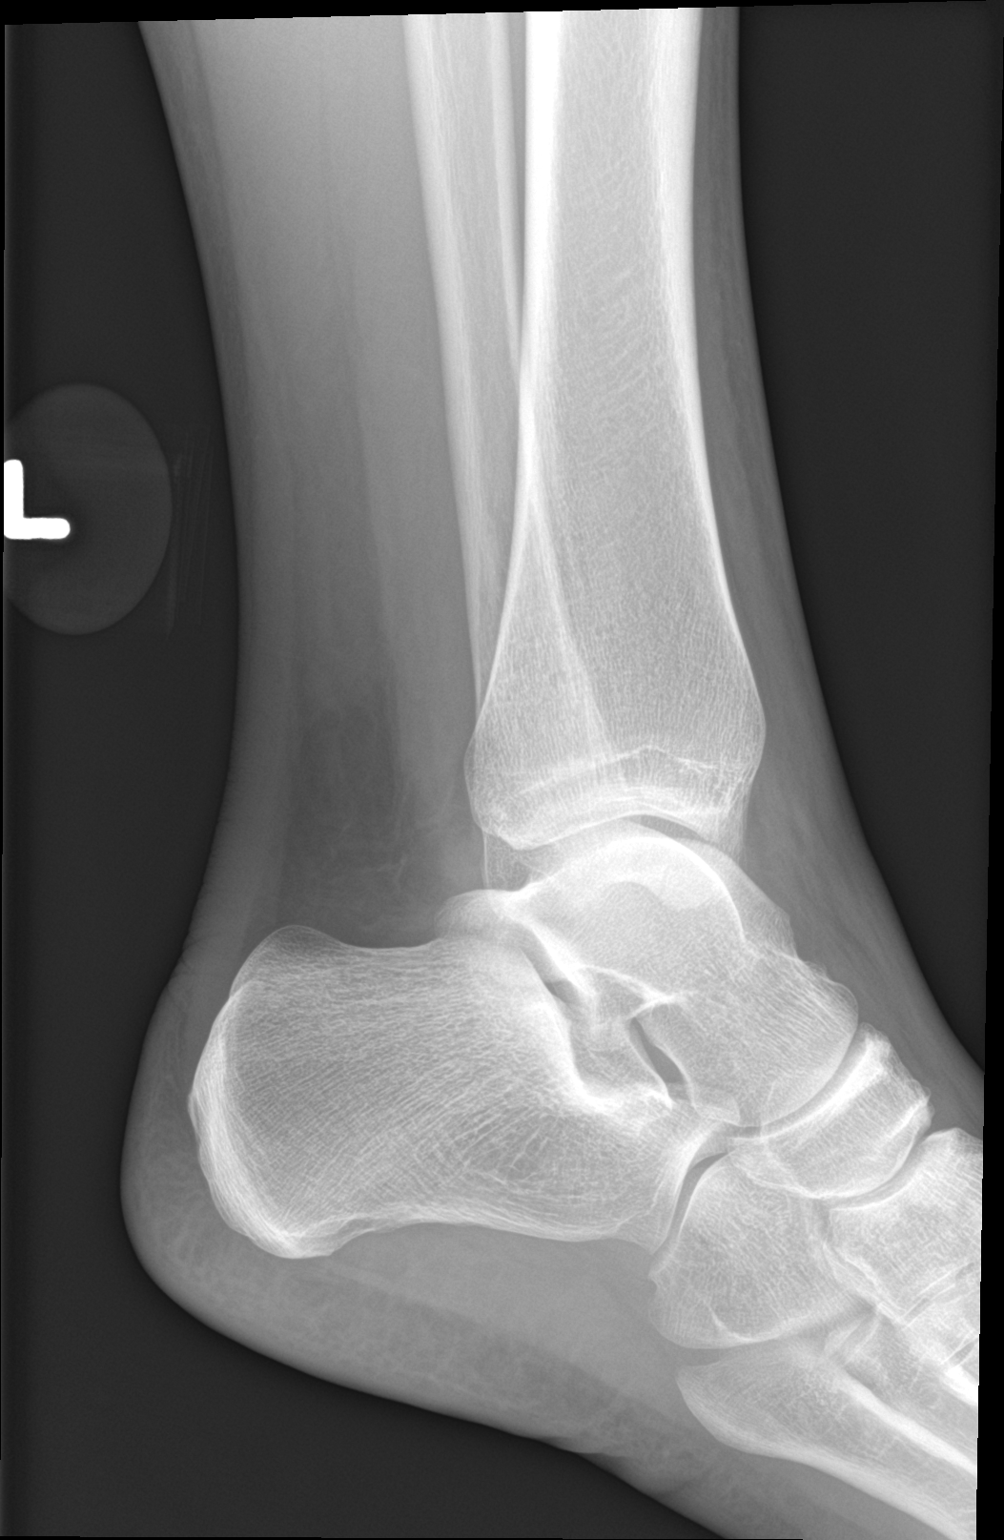

[3 of 3 positions shown; findings below may reference images not displayed]

FINDINGS: There is no evidence of fracture, dislocation, or joint effusion.
There is no evidence of arthropathy or other focal bone abnormality.
Soft tissues are unremarkable.
IMPRESSION: Negative.

## 2019-09-15 DIAGNOSIS — Z3689 Encounter for other specified antenatal screening: Secondary | ICD-10-CM | POA: Diagnosis not present

## 2019-09-15 DIAGNOSIS — Z3200 Encounter for pregnancy test, result unknown: Secondary | ICD-10-CM | POA: Diagnosis not present

## 2019-10-09 DIAGNOSIS — O021 Missed abortion: Secondary | ICD-10-CM | POA: Diagnosis not present

## 2019-10-09 MED FILL — ALPRAZolam 0.5 MG TABS: 0.5 | 1 days supply | Qty: 4 | Fill #0

## 2019-10-09 MED FILL — PROMETHAZINE 25 MG TABLET: 25 | 2 days supply | Qty: 12 | Fill #0

## 2019-10-09 MED FILL — OXYCODONE-APAP 5-325MG: 5-325 | 2 days supply | Qty: 10 | Fill #0

## 2019-10-09 MED FILL — DOXYCYCLINE HYC 100 MG CAPS: 100 | 2 days supply | Qty: 2 | Fill #0

## 2019-10-09 MED FILL — IBUPROFEN 800 MG TAB: 800 | 10 days supply | Qty: 30 | Fill #0

## 2019-10-12 DIAGNOSIS — O021 Missed abortion: Secondary | ICD-10-CM | POA: Diagnosis not present

## 2019-10-14 ENCOUNTER — Ambulatory Visit: Payer: 59 | Attending: Internal Medicine

## 2019-10-14 DIAGNOSIS — Z23 Encounter for immunization: Secondary | ICD-10-CM

## 2019-10-14 NOTE — Progress Notes (Signed)
   Covid-19 Vaccination Clinic  Name:  Brooke Fernandez    MRN: [id number] DOB: [birth year]  10/14/2019  Brooke Fernandez was observed post Covid-19 immunization for 15 minutes without incident. She was provided with Vaccine Information Sheet and instruction to access the V-Safe system.   Brooke Fernandez was instructed to call 911 with any severe reactions post vaccine: Marland Kitchen Difficulty breathing  . Swelling of face and throat  . A fast heartbeat  . A bad rash all over body  . Dizziness and weakness   Immunizations Administered    Name Date Dose VIS Date Route   Pfizer COVID-19 Vaccine 10/14/2019  4:09 PM 0.3 mL 07/15/2018 Intramuscular   Manufacturer: [employer], [employer]   Lot: [id number]   NDC: [address]-3

## 2019-10-26 DIAGNOSIS — F419 Anxiety disorder, unspecified: Secondary | ICD-10-CM | POA: Diagnosis not present

## 2019-10-26 DIAGNOSIS — O021 Missed abortion: Secondary | ICD-10-CM | POA: Diagnosis not present

## 2019-11-07 ENCOUNTER — Ambulatory Visit: Payer: 59 | Attending: Internal Medicine

## 2019-11-07 DIAGNOSIS — Z23 Encounter for immunization: Secondary | ICD-10-CM

## 2019-11-07 NOTE — Progress Notes (Signed)
   Covid-19 Vaccination Clinic  Name:  Brooke Fernandez    MRN: [id number] DOB: 16-May-1986  11/07/2019  Brooke Fernandez was observed post Covid-19 immunization for 15 minutes without incident. She was provided with Vaccine Information Sheet and instruction to access the V-Safe system.   Brooke Fernandez was instructed to call 911 with any severe reactions post vaccine: Marland Kitchen Difficulty breathing  . Swelling of face and throat  . A fast heartbeat  . A bad rash all over body  . Dizziness and weakness   Immunizations Administered    Name Date Dose VIS Date Route   Pfizer COVID-19 Vaccine 11/07/2019  8:09 AM 0.3 mL 07/15/2018 Intramuscular   Manufacturer: [employer], [employer]   Lot: [id number]   NDC: [address]-0

## 2019-12-08 ENCOUNTER — Telehealth (HOSPITAL_COMMUNITY): Payer: Self-pay | Admitting: Family Medicine

## 2019-12-08 MED ORDER — BETAMETHASONE VALERATE 0.1 % EX OINT: 1.0000 "application " | TOPICAL_OINTMENT | Freq: Two times a day (BID) | CUTANEOUS | 0 refills | Status: DC

## 2019-12-08 MED FILL — BETAMETHASONE DP 0.05% CRM: 0.05 | 15 days supply | Qty: 60 | Fill #0

## 2019-12-08 NOTE — Telephone Encounter (Signed)
Allergic reaction to bee sting.

## 2020-01-21 DIAGNOSIS — Z3689 Encounter for other specified antenatal screening: Secondary | ICD-10-CM | POA: Diagnosis not present

## 2020-01-21 DIAGNOSIS — O09291 Supervision of pregnancy with other poor reproductive or obstetric history, first trimester: Secondary | ICD-10-CM | POA: Diagnosis not present

## 2020-01-21 DIAGNOSIS — Z3200 Encounter for pregnancy test, result unknown: Secondary | ICD-10-CM | POA: Diagnosis not present

## 2020-01-21 LAB — OB RESULTS CONSOLE ABO/RH: RH Type: POSITIVE

## 2020-01-21 LAB — OB RESULTS CONSOLE ANTIBODY SCREEN: Antibody Screen: NEGATIVE

## 2020-01-27 DIAGNOSIS — Z3201 Encounter for pregnancy test, result positive: Secondary | ICD-10-CM | POA: Diagnosis not present

## 2020-02-09 DIAGNOSIS — O200 Threatened abortion: Secondary | ICD-10-CM | POA: Diagnosis not present

## 2020-02-09 DIAGNOSIS — O30041 Twin pregnancy, dichorionic/diamniotic, first trimester: Secondary | ICD-10-CM | POA: Diagnosis not present

## 2020-02-09 DIAGNOSIS — Z3A01 Less than 8 weeks gestation of pregnancy: Secondary | ICD-10-CM | POA: Diagnosis not present

## 2020-02-17 ENCOUNTER — Encounter (HOSPITAL_COMMUNITY): Payer: Self-pay | Admitting: Emergency Medicine

## 2020-02-17 ENCOUNTER — Other Ambulatory Visit: Payer: Self-pay

## 2020-02-17 ENCOUNTER — Ambulatory Visit (HOSPITAL_COMMUNITY)
Admission: EM | Admit: 2020-02-17 | Discharge: 2020-02-17 | Disposition: A | Discharge status: Home / Self Care | Payer: 59 | Attending: Emergency Medicine | Admitting: Emergency Medicine

## 2020-02-17 DIAGNOSIS — J00 Acute nasopharyngitis [common cold]: Secondary | ICD-10-CM | POA: Insufficient documentation

## 2020-02-17 DIAGNOSIS — F411 Generalized anxiety disorder: Secondary | ICD-10-CM | POA: Diagnosis not present

## 2020-02-17 DIAGNOSIS — Z20822 Contact with and (suspected) exposure to covid-19: Secondary | ICD-10-CM | POA: Diagnosis not present

## 2020-02-17 DIAGNOSIS — E7800 Pure hypercholesterolemia, unspecified: Secondary | ICD-10-CM | POA: Diagnosis not present

## 2020-02-17 DIAGNOSIS — J040 Acute laryngitis: Secondary | ICD-10-CM | POA: Diagnosis not present

## 2020-02-17 LAB — SARS CORONAVIRUS 2 (TAT 6-24 HRS): SARS Coronavirus 2: NEGATIVE

## 2020-02-17 NOTE — Discharge Instructions (Signed)
Push fluids to ensure adequate hydration and keep secretions thin.  Tylenol as needed.  See provided list of medications safe during pregnancy, or stay in touch with your OB as needed for medications safe during pregnancy to help with symptoms as needed.  Self isolate until covid results are back and negative.  Will notify you by phone of any positive findings. Your negative results will be sent through your MyChart.     If symptoms worsen or do not improve in the next week to return to be seen or to follow up with your PCP.

## 2020-02-17 NOTE — ED Provider Notes (Signed)
[facility]    CSN: [id number] Arrival date & time: 02/17/20  1028      History   Chief Complaint Chief Complaint  Patient presents with  . Sore Throat  . Hoarse  . Nasal Congestion    HPI Brooke Fernandez is a 34 y.o. female.   Brooke Fernandez presents with complaints of voice hoarseness.  Sore throat last night. No fevers. Symptoms started yesterday when she woke up. Worsened yesterday. Nasal drainage. No ear pain. No increased nausea, she is [redacted] weeks pregnant.  No vomiting or diarrhea. No known ill contacts. She does work in Teacher, music. Has gotten her covid-vaccine series. Hasn't taken any medications for symptoms. She feels like she may have allergies, may have had some allergies in the past, but with ongoing pandemic feels like she is more aware of symptoms than maybe she would have been historically.     ROS per HPI, negative if not otherwise mentioned.      Past Medical History:  Diagnosis Date  . Anorexia nervosa, restricting type   . Anxiety   . Bulimia   . Depression   . Elevated cholesterol     Patient Active Problem List   Diagnosis Date Noted  . Excessive daytime sleepiness 10/17/2017  . Hypersomnia with long sleep time, idiopathic 10/17/2017  . Moderate episode of recurrent major depressive disorder (HCC) 12/27/2015  . Generalized anxiety disorder 12/27/2015  . Chronic idiopathic constipation 05/21/1993    History reviewed. No pertinent surgical history.  OB History    Gravida  1   Para      Term      Preterm      AB      Living        SAB      TAB      Ectopic      Multiple      Live Births               Home Medications    Prior to Admission medications   Medication Sig Start Date End Date Taking? Authorizing Provider  Prenatal MV & Min w/FA-DHA (PRENATAL ADULT GUMMY/DHA/FA) 0.4-25 MG CHEW Chew by mouth.   Yes [provider]  betamethasone valerate ointment (VALISONE) 0.1 % Apply 1 application topically 2  (two) times daily. 12/08/19   Janace Aris, NP  Specialty Vitamins Products (ADVANCED COLLAGEN) TABS Take 600 mg by mouth.    [provider]  vortioxetine HBr (TRINTELLIX) 20 MG TABS tablet Take 1 tablet (20 mg total) by mouth daily. Patient taking differently: Take 10 mg by mouth daily. Tapering off 10/22/17   Eksir, Bo Mcclintock, MD    Family History Family History  Problem Relation Age of Onset  . CAD Maternal Grandmother   . Colon cancer Maternal Grandfather   . Breast cancer Maternal Aunt   . Depression Mother   . Irritable bowel syndrome Mother   . Colon polyps Mother   . Anxiety disorder Brother   . Depression Brother     Social History Social History   Tobacco Use  . Smoking status: Never Smoker  . Smokeless tobacco: Never Used  Substance Use Topics  . Alcohol use: Yes    Alcohol/week: 0.0 standard drinks    Comment: 4 glasses of wine a week about 2 nights of drinking  . Drug use: No     Allergies   Patient has no known allergies.   Review of Systems Review of Systems  Physical Exam Triage Vital Signs ED Triage Vitals  Enc Vitals Group     BP 02/17/20 1213 110/70     Pulse Rate 02/17/20 1213 90     Resp 02/17/20 1213 16     Temp 02/17/20 1213 99 F (37.2 C)     Temp Source 02/17/20 1213 Oral     SpO2 02/17/20 1213 98 %     Weight --      Height --      Head Circumference --      Peak Flow --      Pain Score 02/17/20 1209 0     Pain Loc --      Pain Edu? --      Excl. in GC? --    No data found.  Updated Vital Signs BP 110/70 (BP Location: Left Arm)   Pulse 90   Temp 99 F (37.2 C) (Oral)   Resp 16   LMP 05/27/2019   SpO2 98%   Visual Acuity Right Eye Distance:   Left Eye Distance:   Bilateral Distance:    Right Eye Near:   Left Eye Near:    Bilateral Near:     Physical Exam Constitutional:      General: She is not in acute distress.    Appearance: She is well-developed.  HENT:     Nose:     Comments: Voice  hoarseness noted     Mouth/Throat:     Pharynx: Oropharynx is clear. Uvula midline. No posterior oropharyngeal erythema.     Tonsils: No tonsillar exudate.  Cardiovascular:     Rate and Rhythm: Normal rate.  Pulmonary:     Effort: Pulmonary effort is normal.  Skin:    General: Skin is warm and dry.  Neurological:     Mental Status: She is alert and oriented to person, place, and time.      UC Treatments / Results  Labs (all labs ordered are listed, but only abnormal results are displayed) Labs Reviewed  SARS CORONAVIRUS 2 (TAT 6-24 HRS)    EKG   Radiology No results found.  Procedures Procedures (including critical care time)  Medications Ordered in UC Medications - No data to display  Initial Impression / Assessment and Plan / UC Course  I have reviewed the triage vital signs and the nursing notes.  Pertinent labs & imaging results that were available during my care of the patient were reviewed by me and considered in my medical decision making (see chart for details).     Non toxic. Benign physical exam.  History and physical consistent with viral vs allergic etiology. Covid testing pending and isolation instructions provided.  Supportive cares recommended. Medications safe while pregnant list provided. Return precautions provided. Patient verbalized understanding and agreeable to plan.     Final Clinical Impressions(s) / UC Diagnoses   Final diagnoses:  Laryngitis  Acute rhinitis     Discharge Instructions     Push fluids to ensure adequate hydration and keep secretions thin.  Tylenol as needed.  See provided list of medications safe during pregnancy, or stay in touch with your OB as needed for medications safe during pregnancy to help with symptoms as needed.  Self isolate until covid results are back and negative.  Will notify you by phone of any positive findings. Your negative results will be sent through your MyChart.     If symptoms worsen or do not  improve in the next week to return to be seen  or to follow up with your PCP.      ED Prescriptions    None     PDMP not reviewed this encounter.   Georgetta Haber, NP 02/17/20 1326

## 2020-02-17 NOTE — ED Triage Notes (Signed)
Pt c/o nasal drainage, sore throat and hoarseness onset yesterday. She denies any fever. She is [redacted] weeks pregnant.

## 2020-02-25 DIAGNOSIS — Z3A10 10 weeks gestation of pregnancy: Secondary | ICD-10-CM | POA: Diagnosis not present

## 2020-02-25 DIAGNOSIS — O200 Threatened abortion: Secondary | ICD-10-CM | POA: Diagnosis not present

## 2020-02-25 DIAGNOSIS — O30041 Twin pregnancy, dichorionic/diamniotic, first trimester: Secondary | ICD-10-CM | POA: Diagnosis not present

## 2020-02-25 DIAGNOSIS — Z369 Encounter for antenatal screening, unspecified: Secondary | ICD-10-CM | POA: Diagnosis not present

## 2020-03-11 DIAGNOSIS — Z3A11 11 weeks gestation of pregnancy: Secondary | ICD-10-CM | POA: Diagnosis not present

## 2020-03-11 DIAGNOSIS — Z23 Encounter for immunization: Secondary | ICD-10-CM | POA: Diagnosis not present

## 2020-03-11 DIAGNOSIS — Z3689 Encounter for other specified antenatal screening: Secondary | ICD-10-CM | POA: Diagnosis not present

## 2020-03-11 DIAGNOSIS — O09291 Supervision of pregnancy with other poor reproductive or obstetric history, first trimester: Secondary | ICD-10-CM | POA: Diagnosis not present

## 2020-03-22 DIAGNOSIS — Z3481 Encounter for supervision of other normal pregnancy, first trimester: Secondary | ICD-10-CM | POA: Diagnosis not present

## 2020-03-22 DIAGNOSIS — Z3689 Encounter for other specified antenatal screening: Secondary | ICD-10-CM | POA: Diagnosis not present

## 2020-03-23 ENCOUNTER — Other Ambulatory Visit: Payer: Self-pay | Admitting: Obstetrics

## 2020-03-23 DIAGNOSIS — Z8759 Personal history of other complications of pregnancy, childbirth and the puerperium: Secondary | ICD-10-CM

## 2020-03-23 DIAGNOSIS — Z363 Encounter for antenatal screening for malformations: Secondary | ICD-10-CM

## 2020-03-23 DIAGNOSIS — Z3A16 16 weeks gestation of pregnancy: Secondary | ICD-10-CM

## 2020-03-23 DIAGNOSIS — O3110X0 Continuing pregnancy after spontaneous abortion of one fetus or more, unspecified trimester, not applicable or unspecified: Secondary | ICD-10-CM

## 2020-04-06 ENCOUNTER — Encounter: Payer: Self-pay | Admitting: *Deleted

## 2020-04-08 ENCOUNTER — Ambulatory Visit: Payer: 59 | Attending: Obstetrics

## 2020-04-08 ENCOUNTER — Encounter: Payer: Self-pay | Admitting: *Deleted

## 2020-04-08 ENCOUNTER — Other Ambulatory Visit: Payer: Self-pay | Admitting: Obstetrics

## 2020-04-08 ENCOUNTER — Other Ambulatory Visit: Payer: Self-pay | Admitting: *Deleted

## 2020-04-08 ENCOUNTER — Ambulatory Visit: Payer: 59 | Admitting: *Deleted

## 2020-04-08 ENCOUNTER — Other Ambulatory Visit: Payer: Self-pay

## 2020-04-08 VITALS — BP 112/70 | HR 75

## 2020-04-08 DIAGNOSIS — O3110X0 Continuing pregnancy after spontaneous abortion of one fetus or more, unspecified trimester, not applicable or unspecified: Secondary | ICD-10-CM | POA: Diagnosis not present

## 2020-04-08 DIAGNOSIS — Z363 Encounter for antenatal screening for malformations: Secondary | ICD-10-CM | POA: Insufficient documentation

## 2020-04-08 DIAGNOSIS — Z8759 Personal history of other complications of pregnancy, childbirth and the puerperium: Secondary | ICD-10-CM | POA: Diagnosis not present

## 2020-04-08 DIAGNOSIS — Z3A16 16 weeks gestation of pregnancy: Secondary | ICD-10-CM

## 2020-04-13 ENCOUNTER — Other Ambulatory Visit: Payer: Self-pay

## 2020-04-13 NOTE — Progress Notes (Signed)
MISC LAB REPORT FROM OB OFFICE

## 2020-04-26 DIAGNOSIS — O09292 Supervision of pregnancy with other poor reproductive or obstetric history, second trimester: Secondary | ICD-10-CM | POA: Diagnosis not present

## 2020-04-26 DIAGNOSIS — Z3482 Encounter for supervision of other normal pregnancy, second trimester: Secondary | ICD-10-CM | POA: Diagnosis not present

## 2020-05-02 DIAGNOSIS — F411 Generalized anxiety disorder: Secondary | ICD-10-CM | POA: Diagnosis not present

## 2020-05-04 DIAGNOSIS — Z3A20 20 weeks gestation of pregnancy: Secondary | ICD-10-CM | POA: Diagnosis not present

## 2020-05-04 DIAGNOSIS — Z3482 Encounter for supervision of other normal pregnancy, second trimester: Secondary | ICD-10-CM | POA: Diagnosis not present

## 2020-05-04 DIAGNOSIS — Z8759 Personal history of other complications of pregnancy, childbirth and the puerperium: Secondary | ICD-10-CM | POA: Diagnosis not present

## 2020-05-04 DIAGNOSIS — Z362 Encounter for other antenatal screening follow-up: Secondary | ICD-10-CM | POA: Diagnosis not present

## 2020-05-05 ENCOUNTER — Ambulatory Visit: Payer: 59

## 2020-05-21 NOTE — L&D Delivery Note (Signed)
Delivery Note At 1743 a viable female was delivered over intact perineum via svd (Presentation:cephalic ; LOA ).  APGAR: 8,9 ; weight  not yet done.   Placenta status: delivered sponeaneously ,intact .  Cord:  3vv with the following complications: none . Called for delivery.  I was down the hall and making my way to the unit, the patient apparently then began to quickly deliver.  I ran into room and Dr Barb Merino was at perineum with head starting to deliver.  I quickly gowned and then took over as fetus quickly delivered shoulders/body.   Anesthesia:  epidural Episiotomy:  none Lacerations:  none Suture Repair: na Est. Blood Loss (mL):   100 ml  Mom to postpartum.  Baby to Couplet care / Skin to Skin.  Vick Frees 09/22/2020, 6:18 PM

## 2020-05-27 ENCOUNTER — Ambulatory Visit: Payer: 59 | Attending: Internal Medicine

## 2020-05-27 DIAGNOSIS — Z23 Encounter for immunization: Secondary | ICD-10-CM

## 2020-05-27 NOTE — Progress Notes (Signed)
   Covid-19 Vaccination Clinic  Name:  Brooke Fernandez    MRN: [id number] DOB: January 23, 1986  05/27/2020  Brooke Fernandez was observed post Covid-19 immunization for 15 minutes without incident. She was provided with Vaccine Information Sheet and instruction to access the V-Safe system.   Brooke Fernandez was instructed to call 911 with any severe reactions post vaccine: Marland Kitchen Difficulty breathing  . Swelling of face and throat  . A fast heartbeat  . A bad rash all over body  . Dizziness and weakness   Immunizations Administered    Name Date Dose VIS Date Route   Pfizer COVID-19 Vaccine 05/27/2020  2:14 PM 0.3 mL 03/09/2020 Intramuscular   Manufacturer: [employer], [employer]   Lot: [id number]   NDC: [address]-5

## 2020-06-21 DIAGNOSIS — Z3A27 27 weeks gestation of pregnancy: Secondary | ICD-10-CM | POA: Diagnosis not present

## 2020-06-21 DIAGNOSIS — Z331 Pregnant state, incidental: Secondary | ICD-10-CM | POA: Diagnosis not present

## 2020-06-21 DIAGNOSIS — Z3482 Encounter for supervision of other normal pregnancy, second trimester: Secondary | ICD-10-CM | POA: Diagnosis not present

## 2020-06-21 DIAGNOSIS — Z3689 Encounter for other specified antenatal screening: Secondary | ICD-10-CM | POA: Diagnosis not present

## 2020-07-06 DIAGNOSIS — Z3689 Encounter for other specified antenatal screening: Secondary | ICD-10-CM | POA: Diagnosis not present

## 2020-07-06 DIAGNOSIS — Z8759 Personal history of other complications of pregnancy, childbirth and the puerperium: Secondary | ICD-10-CM | POA: Diagnosis not present

## 2020-07-06 DIAGNOSIS — Z3A29 29 weeks gestation of pregnancy: Secondary | ICD-10-CM | POA: Diagnosis not present

## 2020-07-06 DIAGNOSIS — Z3483 Encounter for supervision of other normal pregnancy, third trimester: Secondary | ICD-10-CM | POA: Diagnosis not present

## 2020-07-06 DIAGNOSIS — Z23 Encounter for immunization: Secondary | ICD-10-CM | POA: Diagnosis not present

## 2020-07-27 DIAGNOSIS — M5489 Other dorsalgia: Secondary | ICD-10-CM | POA: Diagnosis not present

## 2020-07-28 DIAGNOSIS — R609 Edema, unspecified: Secondary | ICD-10-CM | POA: Diagnosis not present

## 2020-08-11 DIAGNOSIS — Z3483 Encounter for supervision of other normal pregnancy, third trimester: Secondary | ICD-10-CM | POA: Diagnosis not present

## 2020-08-11 DIAGNOSIS — Z3482 Encounter for supervision of other normal pregnancy, second trimester: Secondary | ICD-10-CM | POA: Diagnosis not present

## 2020-08-16 DIAGNOSIS — Z8759 Personal history of other complications of pregnancy, childbirth and the puerperium: Secondary | ICD-10-CM | POA: Diagnosis not present

## 2020-08-16 DIAGNOSIS — Z3A35 35 weeks gestation of pregnancy: Secondary | ICD-10-CM | POA: Diagnosis not present

## 2020-08-16 DIAGNOSIS — Z3483 Encounter for supervision of other normal pregnancy, third trimester: Secondary | ICD-10-CM | POA: Diagnosis not present

## 2020-08-16 DIAGNOSIS — Z3685 Encounter for antenatal screening for Streptococcus B: Secondary | ICD-10-CM | POA: Diagnosis not present

## 2020-08-23 LAB — OB RESULTS CONSOLE GBS: GBS: NEGATIVE

## 2020-09-05 DIAGNOSIS — O900 Disruption of cesarean delivery wound: Secondary | ICD-10-CM | POA: Diagnosis not present

## 2020-09-15 DIAGNOSIS — R102 Pelvic and perineal pain: Secondary | ICD-10-CM | POA: Diagnosis not present

## 2020-09-16 ENCOUNTER — Other Ambulatory Visit: Payer: Self-pay | Admitting: Obstetrics

## 2020-09-19 ENCOUNTER — Telehealth (HOSPITAL_COMMUNITY): Payer: Self-pay | Admitting: *Deleted

## 2020-09-19 NOTE — Telephone Encounter (Signed)
Preadmission screen  

## 2020-09-20 ENCOUNTER — Inpatient Hospital Stay (HOSPITAL_COMMUNITY): Admit: 2020-09-20 | Payer: Self-pay

## 2020-09-20 DIAGNOSIS — Z3483 Encounter for supervision of other normal pregnancy, third trimester: Secondary | ICD-10-CM | POA: Diagnosis not present

## 2020-09-20 DIAGNOSIS — Z8759 Personal history of other complications of pregnancy, childbirth and the puerperium: Secondary | ICD-10-CM | POA: Diagnosis not present

## 2020-09-20 DIAGNOSIS — Z3A40 40 weeks gestation of pregnancy: Secondary | ICD-10-CM | POA: Diagnosis not present

## 2020-09-20 DIAGNOSIS — O09293 Supervision of pregnancy with other poor reproductive or obstetric history, third trimester: Secondary | ICD-10-CM | POA: Diagnosis not present

## 2020-09-21 ENCOUNTER — Other Ambulatory Visit (HOSPITAL_COMMUNITY)
Admission: RE | Admit: 2020-09-21 | Discharge: 2020-09-21 | Disposition: A | Discharge status: Home / Self Care | Payer: 59 | Source: Ambulatory Visit | Attending: Obstetrics | Admitting: Obstetrics

## 2020-09-21 DIAGNOSIS — Z3A40 40 weeks gestation of pregnancy: Secondary | ICD-10-CM | POA: Diagnosis not present

## 2020-09-21 DIAGNOSIS — Z20822 Contact with and (suspected) exposure to covid-19: Secondary | ICD-10-CM | POA: Insufficient documentation

## 2020-09-21 DIAGNOSIS — Z01812 Encounter for preprocedural laboratory examination: Secondary | ICD-10-CM | POA: Insufficient documentation

## 2020-09-21 DIAGNOSIS — O26893 Other specified pregnancy related conditions, third trimester: Secondary | ICD-10-CM | POA: Diagnosis present

## 2020-09-21 LAB — SARS CORONAVIRUS 2 (TAT 6-24 HRS): SARS Coronavirus 2: NEGATIVE

## 2020-09-22 ENCOUNTER — Inpatient Hospital Stay (HOSPITAL_COMMUNITY)
Admission: AD | Admit: 2020-09-22 | Discharge: 2020-09-24 | DRG: 807 | Disposition: A | Discharge status: Home / Self Care | Payer: 59 | Attending: Obstetrics and Gynecology | Admitting: Obstetrics and Gynecology

## 2020-09-22 ENCOUNTER — Inpatient Hospital Stay (HOSPITAL_COMMUNITY): Payer: 59 | Admitting: Anesthesiology

## 2020-09-22 ENCOUNTER — Other Ambulatory Visit: Payer: Self-pay

## 2020-09-22 ENCOUNTER — Encounter (HOSPITAL_COMMUNITY): Payer: Self-pay | Admitting: Obstetrics and Gynecology

## 2020-09-22 DIAGNOSIS — O26893 Other specified pregnancy related conditions, third trimester: Secondary | ICD-10-CM | POA: Diagnosis present

## 2020-09-22 DIAGNOSIS — Z3A40 40 weeks gestation of pregnancy: Secondary | ICD-10-CM

## 2020-09-22 DIAGNOSIS — Z3490 Encounter for supervision of normal pregnancy, unspecified, unspecified trimester: Secondary | ICD-10-CM | POA: Diagnosis present

## 2020-09-22 DIAGNOSIS — Z20822 Contact with and (suspected) exposure to covid-19: Secondary | ICD-10-CM | POA: Diagnosis present

## 2020-09-22 LAB — CBC
HCT: 41.2 % (ref 36.0–46.0)
Hemoglobin: 13.7 g/dL (ref 12.0–15.0)
MCH: 31.5 pg (ref 26.0–34.0)
MCHC: 33.3 g/dL (ref 30.0–36.0)
MCV: 94.7 fL (ref 80.0–100.0)
Platelets: 271 10*3/uL (ref 150–400)
RBC: 4.35 MIL/uL (ref 3.87–5.11)
RDW: 12.6 % (ref 11.5–15.5)
WBC: 18.6 10*3/uL — ABNORMAL HIGH (ref 4.0–10.5)
nRBC: 0 % (ref 0.0–0.2)

## 2020-09-22 LAB — TYPE AND SCREEN
ABO/RH(D): O POS
Antibody Screen: NEGATIVE

## 2020-09-22 LAB — RESP PANEL BY RT-PCR (FLU A&B, COVID) ARPGX2
Influenza A by PCR: NEGATIVE
Influenza B by PCR: NEGATIVE
SARS Coronavirus 2 by RT PCR: NEGATIVE

## 2020-09-22 MED ORDER — ONDANSETRON HCL 4 MG/2ML IJ SOLN: 4.0000 mg | INTRAMUSCULAR | Status: DC | PRN

## 2020-09-22 MED ORDER — LIDOCAINE HCL (PF) 1 % IJ SOLN
INTRAMUSCULAR | Status: DC | PRN
  Administered 2020-09-22 (×2): 5 mL via EPIDURAL

## 2020-09-22 MED ORDER — BENZOCAINE-MENTHOL 20-0.5 % EX AERO: 1.0000 "application " | INHALATION_SPRAY | CUTANEOUS | Status: DC | PRN

## 2020-09-22 MED ORDER — DIBUCAINE (PERIANAL) 1 % EX OINT: 1.0000 "application " | TOPICAL_OINTMENT | CUTANEOUS | Status: DC | PRN

## 2020-09-22 MED ORDER — LACTATED RINGERS IV SOLN: 500.0000 mL | INTRAVENOUS | Status: DC | PRN

## 2020-09-22 MED ORDER — ACETAMINOPHEN 325 MG PO TABS
650.0000 mg | ORAL_TABLET | ORAL | Status: DC | PRN
  Administered 2020-09-22 – 2020-09-23 (×2): 650 mg via ORAL
  Filled 2020-09-22 (×2): qty 2

## 2020-09-22 MED ORDER — SOD CITRATE-CITRIC ACID 500-334 MG/5ML PO SOLN
30.0000 mL | ORAL | Status: DC | PRN
  Administered 2020-09-22: 30 mL via ORAL
  Filled 2020-09-22: qty 15

## 2020-09-22 MED ORDER — FENTANYL-BUPIVACAINE-NACL 0.5-0.125-0.9 MG/250ML-% EP SOLN
12.0000 mL/h | EPIDURAL | Status: DC | PRN
  Administered 2020-09-22: 12 mL/h via EPIDURAL
  Filled 2020-09-22: qty 250

## 2020-09-22 MED ORDER — OXYTOCIN-SODIUM CHLORIDE 30-0.9 UT/500ML-% IV SOLN: 1.0000 m[IU]/min | INTRAVENOUS | Status: DC

## 2020-09-22 MED ORDER — LACTATED RINGERS IV SOLN
500.0000 mL | Freq: Once | INTRAVENOUS | Status: AC
  Administered 2020-09-22: 500 mL via INTRAVENOUS

## 2020-09-22 MED ORDER — SIMETHICONE 80 MG PO CHEW
80.0000 mg | CHEWABLE_TABLET | ORAL | Status: DC | PRN
  Filled 2020-09-22: qty 1

## 2020-09-22 MED ORDER — EPHEDRINE 5 MG/ML INJ: 10.0000 mg | INTRAVENOUS | Status: DC | PRN

## 2020-09-22 MED ORDER — PRENATAL MULTIVITAMIN CH
1.0000 | ORAL_TABLET | Freq: Every day | ORAL | Status: DC
  Administered 2020-09-23 – 2020-09-24 (×2): 1 via ORAL
  Filled 2020-09-22 (×2): qty 1

## 2020-09-22 MED ORDER — OXYTOCIN-SODIUM CHLORIDE 30-0.9 UT/500ML-% IV SOLN
2.5000 [IU]/h | INTRAVENOUS | Status: DC
  Administered 2020-09-22: 2.5 [IU]/h via INTRAVENOUS
  Filled 2020-09-22: qty 500

## 2020-09-22 MED ORDER — IBUPROFEN 600 MG PO TABS
600.0000 mg | ORAL_TABLET | Freq: Four times a day (QID) | ORAL | Status: DC
  Administered 2020-09-23 – 2020-09-24 (×6): 600 mg via ORAL
  Filled 2020-09-22 (×7): qty 1

## 2020-09-22 MED ORDER — MISOPROSTOL 25 MCG QUARTER TABLET: 25.0000 ug | ORAL_TABLET | ORAL | Status: DC | PRN

## 2020-09-22 MED ORDER — PHENYLEPHRINE 40 MCG/ML (10ML) SYRINGE FOR IV PUSH (FOR BLOOD PRESSURE SUPPORT): 80.0000 ug | PREFILLED_SYRINGE | INTRAVENOUS | Status: DC | PRN

## 2020-09-22 MED ORDER — PHENYLEPHRINE 40 MCG/ML (10ML) SYRINGE FOR IV PUSH (FOR BLOOD PRESSURE SUPPORT)
80.0000 ug | PREFILLED_SYRINGE | INTRAVENOUS | Status: DC | PRN
  Filled 2020-09-22: qty 10

## 2020-09-22 MED ORDER — DIPHENHYDRAMINE HCL 25 MG PO CAPS: 25.0000 mg | ORAL_CAPSULE | Freq: Four times a day (QID) | ORAL | Status: DC | PRN

## 2020-09-22 MED ORDER — LIDOCAINE HCL (PF) 1 % IJ SOLN: 30.0000 mL | INTRAMUSCULAR | Status: DC | PRN

## 2020-09-22 MED ORDER — TETANUS-DIPHTH-ACELL PERTUSSIS 5-2.5-18.5 LF-MCG/0.5 IM SUSY: 0.5000 mL | PREFILLED_SYRINGE | Freq: Once | INTRAMUSCULAR | Status: DC

## 2020-09-22 MED ORDER — TERBUTALINE SULFATE 1 MG/ML IJ SOLN: 0.2500 mg | Freq: Once | INTRAMUSCULAR | Status: DC | PRN

## 2020-09-22 MED ORDER — COCONUT OIL OIL: 1.0000 "application " | TOPICAL_OIL | Status: DC | PRN

## 2020-09-22 MED ORDER — OXYTOCIN BOLUS FROM INFUSION
333.0000 mL | Freq: Once | INTRAVENOUS | Status: AC
  Administered 2020-09-22: 333 mL via INTRAVENOUS

## 2020-09-22 MED ORDER — LACTATED RINGERS IV SOLN: INTRAVENOUS | Status: DC

## 2020-09-22 MED ORDER — ACETAMINOPHEN 325 MG PO TABS: 650.0000 mg | ORAL_TABLET | ORAL | Status: DC | PRN

## 2020-09-22 MED ORDER — DIPHENHYDRAMINE HCL 50 MG/ML IJ SOLN: 12.5000 mg | INTRAMUSCULAR | Status: DC | PRN

## 2020-09-22 MED ORDER — ONDANSETRON HCL 4 MG/2ML IJ SOLN
4.0000 mg | Freq: Four times a day (QID) | INTRAMUSCULAR | Status: DC | PRN
  Administered 2020-09-22: 4 mg via INTRAVENOUS
  Filled 2020-09-22: qty 2

## 2020-09-22 MED ORDER — ONDANSETRON HCL 4 MG PO TABS: 4.0000 mg | ORAL_TABLET | ORAL | Status: DC | PRN

## 2020-09-22 MED ORDER — SENNOSIDES-DOCUSATE SODIUM 8.6-50 MG PO TABS
2.0000 | ORAL_TABLET | Freq: Every day | ORAL | Status: DC
  Administered 2020-09-23 – 2020-09-24 (×2): 2 via ORAL
  Filled 2020-09-22 (×2): qty 2

## 2020-09-22 MED ORDER — WITCH HAZEL-GLYCERIN EX PADS: 1.0000 "application " | MEDICATED_PAD | CUTANEOUS | Status: DC | PRN

## 2020-09-22 MED ORDER — OXYTOCIN-SODIUM CHLORIDE 30-0.9 UT/500ML-% IV SOLN
1.0000 m[IU]/min | INTRAVENOUS | Status: DC
  Administered 2020-09-22: 2 m[IU]/min via INTRAVENOUS

## 2020-09-22 NOTE — Anesthesia Procedure Notes (Signed)
Epidural Patient location during procedure: OB Start time: 09/22/2020 11:40 AM End time: 09/22/2020 11:50 AM  Staffing Anesthesiologist: Mellody Dance, MD Performed: anesthesiologist   Preanesthetic Checklist Completed: patient identified, IV checked, site marked, risks and benefits discussed, monitors and equipment checked, pre-op evaluation and timeout performed  Epidural Patient position: sitting Prep: DuraPrep Patient monitoring: heart rate, cardiac monitor, continuous pulse ox and blood pressure Approach: midline Location: L2-L3 Injection technique: LOR saline  Needle:  Needle type: Tuohy  Needle gauge: 17 G Needle length: 9 cm Needle insertion depth: 4.5 cm Catheter type: closed end flexible Catheter size: 20 Guage Catheter at skin depth: 9.5 cm Test dose: negative and Other  Assessment Events: blood not aspirated, injection not painful, no injection resistance and negative IV test  Additional Notes Informed consent obtained prior to proceeding including risk of failure, 1% risk of PDPH, risk of minor discomfort and bruising.  Discussed rare but serious complications including epidural abscess, permanent nerve injury, epidural hematoma.  Discussed alternatives to epidural analgesia and patient desires to proceed.  Timeout performed pre-procedure verifying patient name, procedure, and platelet count.  Patient tolerated procedure well.

## 2020-09-22 NOTE — Progress Notes (Signed)
Brooke Fernandez is a 35 y.o. G3P0020 at [redacted]w[redacted]d admitted for spontaneous labor, onset at 0130, active labor at 82.  Subjective: Comfortable with epidural just placed, spouse Brooke Fernandez and mother at [facility] and supportive.   Objective: Vitals:   09/22/20 1216 09/22/20 1221 09/22/20 1226 09/22/20 1231  BP: 109/70 111/65 114/65 111/65  Pulse: 72 66 70 69  Resp:      Temp:      TempSrc:      SpO2:      Weight:      Height:         FHT:  FHR: 130 bpm, variability: moderate,  accelerations:  Present,  decelerations:  Absent UC:   regular, every 2-3 minutes SVE:   Dilation: 5 Effacement (%): 90 Station: -1 Exam by:: Arlan Organ, CNM AROM with patient consent Clear AF and some bloody show Vertex  Labs:   Recent Labs    09/22/20 1036  WBC 18.6*  HGB 13.7  HCT 41.2  PLT 271    Assessment / Plan: G3P0020 35 y.o. [redacted]w[redacted]d Spontaneous labor, progressing normally  Labor: AROM augmentation Preeclampsia:  no signs or symptoms of toxicity Fetal Wellbeing:  Category I Pain Control:  Epidural I/D:  GBS neg Anticipated MOD:  NSVD   MD to follow, will give update.   Neta Mends, CNM, MSN 09/22/2020, 12:49 PM

## 2020-09-22 NOTE — Progress Notes (Signed)
Comfortable, doesn't feel any pain/pressure No c/o  Patient Vitals for the past 24 hrs:  BP Temp Temp src Pulse Resp SpO2 Height Weight  09/22/20 1631 114/69 -- -- 60 -- -- -- --  09/22/20 1601 (!) 96/52 -- -- 75 -- -- -- --  09/22/20 1531 (!) 106/59 -- -- 72 -- -- -- --  09/22/20 1501 119/77 -- -- 68 -- -- -- --  09/22/20 1431 102/65 -- -- 63 -- -- -- --  09/22/20 1402 (!) 96/56 -- -- 71 -- -- -- --  09/22/20 1331 108/69 98 F (36.7 C) Oral 67 -- -- -- --  09/22/20 1301 102/62 -- -- 71 -- -- -- --  09/22/20 1231 111/65 -- -- 69 -- -- -- --  09/22/20 1226 114/65 -- -- 70 -- -- -- --  09/22/20 1221 111/65 -- -- 66 -- -- -- --  09/22/20 1216 109/70 -- -- 72 -- -- -- --  09/22/20 1211 108/64 -- -- 74 -- 97 % -- --  09/22/20 1207 109/65 -- -- 72 -- 99 % -- --  09/22/20 1201 108/66 -- -- 72 -- -- -- --  09/22/20 1156 (!) 99/59 -- -- 74 -- -- -- --  09/22/20 1151 106/66 -- -- 64 -- 99 % -- --  09/22/20 1102 121/68 -- -- 66 -- -- -- --  09/22/20 0950 98/80 -- -- 65 -- -- -- --  09/22/20 0935 124/73 (!) 97.5 F (36.4 C) Oral 68 20 100 % 5\' 4"  (1.626 m) 76.2 kg   A&ox3 nml respirations Abd: soft, nt, gravid; efw 7 1/2-8lbs LE: no edema, nt bilat  FHT:  TOCO:   A/P: iup at 40.2 1. Active labor - pt started on pitocin to augment as there was no cervical change 2 hrs after arom and irregular ctx; pt has now progressed quickly and plan svd 2. Fetal status reassuring 3. gbs negative 4. RH pos 5. Rubella Immune

## 2020-09-22 NOTE — Anesthesia Preprocedure Evaluation (Signed)
Anesthesia Evaluation  Patient identified by MRN, date of birth, ID band Patient awake    Reviewed: Allergy & Precautions, NPO status , Patient's Chart, lab work & pertinent test results  Airway Mallampati: I  TM Distance: >3 FB Neck ROM: Full    Dental no notable dental hx.    Pulmonary neg pulmonary ROS,    Pulmonary exam normal breath sounds clear to auscultation       Cardiovascular negative cardio ROS Normal cardiovascular exam Rhythm:Regular Rate:Normal     Neuro/Psych PSYCHIATRIC DISORDERS Anxiety Depression negative neurological ROS     GI/Hepatic negative GI ROS, Neg liver ROS,   Endo/Other  negative endocrine ROS  Renal/GU negative Renal ROS  negative genitourinary   Musculoskeletal negative musculoskeletal ROS (+)   Abdominal   Peds negative pediatric ROS (+)  Hematology negative hematology ROS (+)   Anesthesia Other Findings   Reproductive/Obstetrics (+) Pregnancy                             Anesthesia Physical Anesthesia Plan  ASA: II  Anesthesia Plan: Epidural   Post-op Pain Management:    Induction:   PONV Risk Score and Plan: 2 and Ondansetron and Treatment may vary due to age or medical condition  Airway Management Planned: Natural Airway  Additional Equipment: None  Intra-op Plan:   Post-operative Plan:   Informed Consent: I have reviewed the patients History and Physical, chart, labs and discussed the procedure including the risks, benefits and alternatives for the proposed anesthesia with the patient or authorized representative who has indicated his/her understanding and acceptance.       Plan Discussed with: Anesthesiologist  Anesthesia Plan Comments:         Anesthesia Quick Evaluation

## 2020-09-22 NOTE — MAU Note (Signed)
Presents with ctxs 4-5 minutes apart for approximately 1.5 hours.  Denies LOF or VB.  Reports +FM.

## 2020-09-22 NOTE — H&P (Signed)
Brooke Fernandez is a 35 y.o. female presenting for early labor. OB History    Gravida  3   Para      Term      Preterm      AB  2   Living  0     SAB  2   IAB      Ectopic      Multiple      Live Births             Past Medical History:  Diagnosis Date  . Anorexia nervosa, restricting type   . Anxiety   . Bulimia   . Depression   . Elevated cholesterol    Past Surgical History:  Procedure Laterality Date  . NO PAST SURGERIES     Family History: family history includes Anxiety disorder in her brother; Breast cancer in her maternal aunt; CAD in her maternal grandmother; Colon cancer in her maternal grandfather; Colon polyps in her mother; Depression in her brother and mother; Irritable bowel syndrome in her mother. Social History:  reports that she has never smoked. She has never used smokeless tobacco. She reports previous alcohol use. She reports that she does not use drugs.     Maternal Diabetes: No Genetic Screening: Normal Maternal Ultrasounds/Referrals: Normal Fetal Ultrasounds or other Referrals:  None Maternal Substance Abuse:  Na Significant Maternal Medications:  None Significant Maternal Lab Results:  Group B Strep negative Other Comments:  None  Review of Systems  Constitutional: Negative.   All other systems reviewed and are negative.  Maternal Medical History:  Reason for admission: Contractions.   Contractions: Onset was 1-2 hours ago.   Frequency: irregular.   Perceived severity is moderate.    Fetal activity: Perceived fetal activity is normal.   Last perceived fetal movement was within the past hour.    Prenatal complications: no prenatal complications Prenatal Complications - Diabetes: none.    Dilation: 5 Effacement (%): 70 Station: -1 Exam by:: K.Wilson,RN Blood pressure 98/80, pulse 65, temperature (!) 97.5 F (36.4 C), temperature source Oral, resp. rate 20, height 5\' 4"  (1.626 m), weight 76.2 kg, last menstrual period  12/20/2019, SpO2 100 %. Maternal Exam:  Uterine Assessment: Contraction strength is moderate.  Contraction frequency is irregular.   Abdomen: Patient reports no abdominal tenderness. Fetal presentation: vertex  Introitus: Normal vulva. Normal vagina.  Ferning test: not done.  Nitrazine test: not done. Amniotic fluid character: not assessed.  Cervix: Cervix evaluated by digital exam.     Physical Exam Vitals and nursing note reviewed.  Constitutional:      Appearance: Normal appearance.  HENT:     Head: Normocephalic and atraumatic.  Cardiovascular:     Rate and Rhythm: Normal rate and regular rhythm.     Pulses: Normal pulses.     Heart sounds: Normal heart sounds.  Pulmonary:     Effort: Pulmonary effort is normal.     Breath sounds: Normal breath sounds.  Abdominal:     Palpations: Abdomen is soft.  Genitourinary:    General: Normal vulva.  Musculoskeletal:        General: Normal range of motion.     Cervical back: Normal range of motion and neck supple.  Skin:    General: Skin is warm and dry.  Neurological:     General: No focal deficit present.     Mental Status: She is alert and oriented to person, place, and time.  Psychiatric:  Mood and Affect: Mood normal.        Behavior: Behavior normal.     Prenatal labs: ABO, Rh: O/Positive/-- (09/02 0000) Antibody: Negative (09/02 0000) Rubella:  imm RPR:   neg HBsAg:   neg HIV:   neg GBS:   neg  Assessment/Plan: Term IUP Early labor Admit   Dustee Bottenfield J 09/22/2020, 10:33 AM

## 2020-09-22 NOTE — Lactation Note (Signed)
This note was copied from a baby's chart. Lactation Consultation Note  Patient Name: Brooke Fernandez Today's Date: 09/22/2020 Reason for consult: L&D Initial assessment;Term;Primapara;1st time breastfeeding Age:33 hours  L&D consult with 48 minutes old infant and P1 mother. Parents and grandmother present at time of consult. Congratulated them on their newborn. Infant is skin to skin prone on mother's chest. Discussed STS as ideal transition for infants after birth helping with temperature, blood sugar and comfort. Talked about primal reflexes such as rooting, hands to mouth, searching for the breast among others.   Assisted with latch laid back position, infant holds nipple in mouth. Explained LC services availability during postpartum stay. Thanked family for their time.    Maternal Data Has patient been taught Hand Expression?: No Does the patient have breastfeeding experience prior to this delivery?: No  Feeding Mother's Current Feeding Choice: Breast Milk  LATCH Score Latch: Repeated attempts needed to sustain latch, nipple held in mouth throughout feeding, stimulation needed to elicit sucking reflex.  Audible Swallowing: None  Type of Nipple: Everted at rest and after stimulation (short shafted nipples)  Comfort (Breast/Nipple): Soft / non-tender  Hold (Positioning): Assistance needed to correctly position infant at breast and maintain latch.  LATCH Score: 6   Lactation Tools Discussed/Used    Interventions Interventions: Assisted with latch;Skin to skin;Expressed milk;Education  Discharge    Consult Status Consult Status: Follow-up Date: 09/22/20 Follow-up type: In-patient    Anjel Perfetti A Higuera Ancidey 09/22/2020, 6:31 PM

## 2020-09-23 ENCOUNTER — Inpatient Hospital Stay (HOSPITAL_COMMUNITY): Payer: 59

## 2020-09-23 ENCOUNTER — Inpatient Hospital Stay (HOSPITAL_COMMUNITY): Admission: AD | Admit: 2020-09-23 | Payer: 59 | Source: Home / Self Care | Admitting: Obstetrics

## 2020-09-23 LAB — CBC
HCT: 34.9 % — ABNORMAL LOW (ref 36.0–46.0)
Hemoglobin: 11.8 g/dL — ABNORMAL LOW (ref 12.0–15.0)
MCH: 32.2 pg (ref 26.0–34.0)
MCHC: 33.8 g/dL (ref 30.0–36.0)
MCV: 95.1 fL (ref 80.0–100.0)
Platelets: 227 10*3/uL (ref 150–400)
RBC: 3.67 MIL/uL — ABNORMAL LOW (ref 3.87–5.11)
RDW: 12.9 % (ref 11.5–15.5)
WBC: 16.9 10*3/uL — ABNORMAL HIGH (ref 4.0–10.5)
nRBC: 0 % (ref 0.0–0.2)

## 2020-09-23 LAB — RPR: RPR Ser Ql: NONREACTIVE

## 2020-09-23 NOTE — Lactation Note (Signed)
This note was copied from a baby's chart. Lactation Consultation Note  Patient Name: Brooke Fernandez Today's Date: 09/23/2020   Age:33 hours P1, term female infant, LC entered room, mom and infant asleep at this time. Maternal Data    Feeding    LATCH Score                    Lactation Tools Discussed/Used    Interventions    Discharge    Consult Status      Danelle Earthly 09/23/2020, 12:59 AM

## 2020-09-23 NOTE — Anesthesia Postprocedure Evaluation (Signed)
Anesthesia Post Note  Patient: Brooke Fernandez  Procedure(s) Performed: AN AD HOC LABOR EPIDURAL     Patient location during evaluation: Mother Baby Anesthesia Type: Epidural Level of consciousness: awake and alert and oriented Pain management: satisfactory to patient Vital Signs Assessment: post-procedure vital signs reviewed and stable Respiratory status: respiratory function stable Cardiovascular status: stable Postop Assessment: no headache, no backache, epidural receding, patient able to bend at knees, no signs of nausea or vomiting, adequate PO intake and able to ambulate Anesthetic complications: no   No complications documented.  Last Vitals:  Vitals:   09/23/20 0544 09/23/20 0941  BP: 113/79 108/78  Pulse: 69 69  Resp: 18 20  Temp: 36.7 C 36.4 C  SpO2: 99% 96%    Last Pain:  Vitals:   09/23/20 1127  TempSrc:   PainSc: 3    Pain Goal:                   Casee Knepp

## 2020-09-23 NOTE — Lactation Note (Addendum)
This note was copied from a baby's chart. Lactation Consultation Note  Patient Name: Brooke Fernandez JIRCV'E Date: 09/23/2020 Reason for consult: Follow-up assessment;Term;Primapara Age:33 hours  I was requested to return to assist with latching as parents and nurse tech had been attempting to get [name] to feed at breast, but without success. I assisted, but was also unsuccessful despite trying a nipple shield, etc. Mom decided that she would prefer to bottle-feed formula for the remainder of the day (Mom declined DBM).  Mom has not given up on breastfeeding, but she is also not interested in pumping at this time.    [name] [facility] 09/23/2020, 4:19 PM

## 2020-09-23 NOTE — Discharge Instructions (Signed)
Lactation outpatient support - home visit  Linda Coppola RN, MHA, IBCLC at [facility]: Lactation Consultant  [email or web address] Mail: [email or web address] Tel: [phone]    Additional resources:  [facility] [email or web address]   Chiropractic specialist   Dr. Leanna Hastings [email or web address]  Craniosacral therapy for baby  Erin Balkind  [email or web address]  

## 2020-09-23 NOTE — Progress Notes (Addendum)
CSW met with Brooke Fernandez to complete consult for hx of anxiety and depression. CSW observed Brooke Fernandez resting in bed, infant Brooke Fernandez) in bassinet, and FOB Brooke Fernandez) sitting on the couch. Brooke Fernandez gave CSW verbal consent to complete consult while FOB was present. CSW explained role and reason for consult. Brooke Fernandez was pleasant, polite and engaged with CSW. Brooke Fernandez reported,  her first time experiencing anxiety and depression was around the age of 53. Brooke Fernandez reported, she stop taken antidepressants back in 2020 and is managing her symptoms well with no medication. Brooke Fernandez reported, hx of therapy through [facility], but feel as though she does not need services at this moment.   CSW provided education regarding the baby blues period vs. perinatal mood disorders, discussed treatment and gave resources for mental health follow up if concerns arise. CSW recommends self- evaluation during the postpartum time period using the New Mom Checklist from Postpartum Progress and encouraged Brooke Fernandez to contact a medical professional if symptoms are noted at any time.   When CSW asked Brooke Fernandez about her emotions since delivery. Brooke Fernandez reported, she is feeling good. Brooke Fernandez identified, FOB and her parents as her supports. Brooke Fernandez denied SI, and HI when CSW assessed for safety.   Brooke Fernandez reported, she has all essentials needed to care for infant and there are no barriers to follow up care. Brooke Fernandez reported, infant has a new car seat, bassinet, and crib. Brooke Fernandez denied any additional barriers.     CSW provided education on Sudden Infant Death Syndrome (SIDS).    CSW identifies no further need for intervention or barriers to discharge at this time.

## 2020-09-23 NOTE — Lactation Note (Signed)
This note was copied from a baby's chart. Lactation Consultation Note  Patient Name: Brooke Fernandez AXKPV'V Date: 09/23/2020   Age:33 hours  Parents report that "[name]" kept falling asleep once latched. Infant was put to the bare breast & did not latch (sleepy). Hand expression was taught to Mom & the resulting amount was finger-fed (sucked off of a gloved finger) and spoon-fed. Infant was then ready to latch.  Mom's nipples invert some with compression, so Mom was placed in side-lying hold & using the teacup hold. Infant was able to latch. Mom was comfortable with latch. Some swallows noted (verified by cervical auscultation), but also apparent by change in jaw movement.   Parents understand they can replicate the above if infant is too sleepy to eat (hand express, give EBM on a gloved finger, etc.).   Mom has a Research scientist (medical) at home. She will need their smallest flange. If she uses a hand pump, etc while here, she will need the size 21 flange.  [name] [facility] 09/23/2020, 8:19 AM

## 2020-09-23 NOTE — Progress Notes (Signed)
PPD # 1 S/P NSVD  Live born female  Birth Weight: 8 lb 7.8 oz (3850 g) APGAR: 8, 9  Newborn Delivery   Birth date/time: 09/22/2020 17:43:00 Delivery type: Vaginal, Spontaneous     Baby name: [name] Delivering provider: Rhoderick Moody E  Episiotomy:None   Lacerations:None   Feeding: breast  Pain control at delivery: Epidural   S:  Reports feeling very well, happy with birth experience.             Tolerating po/ No nausea or vomiting             Bleeding is light             Pain controlled with acetaminophen and ibuprofen (OTC)             Up ad lib / ambulatory / voiding without difficulties   O:  A & O x 3, in no apparent distress              VS:  Vitals:   09/22/20 2110 09/23/20 0121 09/23/20 0544 09/23/20 0941  BP: 110/65 101/71 113/79 108/78  Pulse: 88 73 69 69  Resp: 18 16 18 20   Temp: 98.4 F (36.9 C) 98.2 F (36.8 C) 98 F (36.7 C) 97.6 F (36.4 C)  TempSrc: Oral Oral Oral Oral  SpO2: 97% 98% 99% 96%  Weight:      Height:        LABS:  Recent Labs    09/22/20 1036 09/23/20 0533  WBC 18.6* 16.9*  HGB 13.7 11.8*  HCT 41.2 34.9*  PLT 271 227    Blood type: --/--/O POS (05/05 1036)  Rubella:   immune  I&O: I/O last 3 completed shifts: In: -  Out: 1300 [Urine:1200; Blood:100]          No intake/output data recorded.  Vaccines: TDaP          UTD         Flu             UTD                    COVID-19 UTD  Gen: AAO x 3, NAD  Abdomen: soft, non-tender, non-distended             Fundus: firm, non-tender, U-1  Perineum: intact  Lochia: small  Extremities: no edema, no calf pain or tenderness    A/P: PPD # 1 34 y.o., 06-09-1999   Principal Problem:   Postpartum care following vaginal delivery 5/5 Active Problems:   Encounter for induction of labor   SVD (spontaneous vaginal delivery)   Doing well - stable status  Routine post partum orders  Anticipate discharge tomorrow    [id number], MSN, CNM 09/23/2020, 10:45 AM

## 2020-09-24 MED ORDER — IBUPROFEN 600 MG PO TABS
600.0000 mg | ORAL_TABLET | Freq: Four times a day (QID) | ORAL | 0 refills | Status: DC
  Filled 2020-09-24: qty 30, 8d supply, fill #0

## 2020-09-24 MED ORDER — ACETAMINOPHEN 325 MG PO TABS
650.0000 mg | ORAL_TABLET | ORAL | 1 refills | Status: DC | PRN
  Filled 2020-09-24: qty 60, 5d supply, fill #0

## 2020-09-24 NOTE — Discharge Summary (Signed)
OB Discharge Summary  Patient Name: Brooke Fernandez DOB: January 01, 1986 MRN: [id number]  Date of admission: 09/22/2020 Delivering MD: Rhoderick Moody E   Date of discharge: 09/24/2020  Admitting diagnosis: Active labor at term  intrauterine pregnancy: [redacted]w[redacted]d     Additional problems: None     Discharge diagnosis: Term Pregnancy Delivered                                                                     Post partum procedures:None  Augmentation: Per labor and delivery notes  Complications: None  Hospital course:  Onset of Labor With Vaginal Delivery      35 y.o. yo [name] at [redacted]w[redacted]d was admitted in Active Labor on 09/22/2020. Patient had an uncomplicated labor course as follows:  Membrane Rupture Time/Date: 12:45 PM ,09/22/2020   Delivery Method:Vaginal, Spontaneous  Episiotomy: None  Lacerations:  None  Patient had an uncomplicated postpartum course.  She is ambulating, tolerating a regular diet, passing flatus, and urinating well. Patient is discharged home in stable condition on 09/24/20.  Newborn Data: Birth date:09/22/2020  Birth time:5:43 PM  Gender:Female  Living status:Living  Apgars:8 ,9  Weight:3850 g   On day of discharge patient notes ambulating, voiding, minimal lochia, pain controlled.  Tolerating regular diet with flatus.  Breast-feeding adequately.  Physical exam  Vitals:   09/23/20 0941 09/23/20 1404 09/23/20 2106 09/24/20 0559  BP: 108/78 105/73 112/67 109/77  Pulse: 69 64 63 67  Resp: 20 16 18 16   Temp: 97.6 F (36.4 C) 97.8 F (36.6 C) 97.8 F (36.6 C) (!) 97.5 F (36.4 C)  TempSrc: Oral Oral Oral Oral  SpO2: 96% 100% 97%   Weight:      Height:       General: alert, cooperative and no distress Lochia: appropriate Uterine Fundus: firm Incision: N/A DVT Evaluation: No evidence of DVT seen on physical exam. Labs: Lab Results  Component Value Date   WBC 16.9 (H) 09/23/2020   HGB 11.8 (L) 09/23/2020   HCT 34.9 (L) 09/23/2020   MCV 95.1 09/23/2020    PLT 227 09/23/2020   CMP Latest Ref Rng & Units 08/27/2017  Glucose 65 - 99 mg/dL 72  BUN 6 - 20 mg/dL 17  Creatinine 10/27/2017 - 1.76 mg/dL 1.60  Sodium 7.37 - 106 mmol/L 143  Potassium 3.5 - 5.2 mmol/L 4.5  Chloride 96 - 106 mmol/L 104  CO2 20 - 29 mmol/L 21  Calcium 8.7 - 10.2 mg/dL 9.8  Total Protein 6.0 - 8.5 g/dL 7.3  Total Bilirubin 0.0 - 1.2 mg/dL 269  Alkaline Phos 39 - 117 IU/L 69  AST 0 - 40 IU/L 21  ALT 0 - 32 IU/L 17    Discharge instruction: per After Visit Summary and "Baby and Me Booklet".  After Visit Meds:  Allergies as of 09/24/2020   No Known Allergies     Medication List    TAKE these medications   acetaminophen 325 MG tablet Commonly known as: Tylenol Take 2 tablets (650 mg total) by mouth every 4 (four) hours as needed (for pain scale < 4).   Advanced Collagen Tabs Take 600 mg by mouth.   betamethasone valerate ointment 0.1 % Commonly known as: VALISONE Apply 1  application topically 2 (two) times daily.   ibuprofen 600 MG tablet Commonly known as: ADVIL Take 1 tablet (600 mg total) by mouth every 6 (six) hours.   Prenatal Adult Gummy/DHA/FA 0.4-25 MG Chew Chew by mouth.   vortioxetine HBr 20 MG Tabs tablet Commonly known as: Trintellix Take 1 tablet (20 mg total) by mouth daily. What changed:   how much to take  additional instructions       Diet: routine diet  Activity: Advance as tolerated. Pelvic rest for 6 weeks.   Outpatient follow up:6 weeks Follow up Appt:No future appointments. Follow up visit: No follow-ups on file.  Postpartum contraception: Not Discussed  Newborn Data: Live born female  Birth Weight: 8 lb 7.8 oz (3850 g) APGAR: 8, 9  Newborn Delivery   Birth date/time: 09/22/2020 17:43:00 Delivery type: Vaginal, Spontaneous      Baby Feeding: Breast Disposition:home with mother   09/24/2020 Lendon Colonel, MD

## 2020-09-24 NOTE — Lactation Note (Signed)
This note was copied from a baby's chart. Lactation Consultation Note  Patient Name: Brooke Fernandez OMBTD'H Date: 09/24/2020   Age:33 hours  Mom declined lactation visit prior to discharge.  [name] [facility] 09/24/2020, 9:33 AM

## 2020-09-26 ENCOUNTER — Other Ambulatory Visit (HOSPITAL_COMMUNITY): Payer: Self-pay

## 2020-10-03 ENCOUNTER — Other Ambulatory Visit (HOSPITAL_COMMUNITY): Payer: Self-pay

## 2020-10-03 ENCOUNTER — Other Ambulatory Visit: Payer: Self-pay

## 2020-10-03 ENCOUNTER — Encounter (HOSPITAL_COMMUNITY): Payer: Self-pay

## 2020-10-03 ENCOUNTER — Ambulatory Visit (HOSPITAL_COMMUNITY)
Admission: EM | Admit: 2020-10-03 | Discharge: 2020-10-03 | Disposition: A | Discharge status: Home / Self Care | Payer: 59 | Attending: Family Medicine | Admitting: Family Medicine

## 2020-10-03 DIAGNOSIS — B9689 Other specified bacterial agents as the cause of diseases classified elsewhere: Secondary | ICD-10-CM

## 2020-10-03 DIAGNOSIS — N390 Urinary tract infection, site not specified: Secondary | ICD-10-CM | POA: Insufficient documentation

## 2020-10-03 LAB — POCT URINALYSIS DIPSTICK, ED / UC
Bilirubin Urine: NEGATIVE
Glucose, UA: NEGATIVE mg/dL
Ketones, ur: NEGATIVE mg/dL
Nitrite: NEGATIVE
Protein, ur: NEGATIVE mg/dL
Specific Gravity, Urine: 1.01 (ref 1.005–1.030)
Urobilinogen, UA: 0.2 mg/dL (ref 0.0–1.0)
pH: 7 (ref 5.0–8.0)

## 2020-10-03 MED ORDER — CEPHALEXIN 500 MG PO CAPS
500.0000 mg | ORAL_CAPSULE | Freq: Two times a day (BID) | ORAL | 0 refills | Status: DC
  Filled 2020-10-03: qty 10, 5d supply, fill #0

## 2020-10-03 NOTE — ED Provider Notes (Signed)
[facility]    CSN: [id number] Arrival date & time: 10/03/20  1511      History   Chief Complaint Chief Complaint  Patient presents with  . uti sx    HPI Brooke Fernandez is a 35 y.o. female.   Patient presenting today with several day history of dysuria.  Denies abdominal pain, flank pain, vaginal discharge or itching, urinary frequency, fever, nausea vomiting diarrhea.  Has been trying to drink more water since onset without relief.  Of note, recent vaginal delivery 2 weeks ago.  States lochia is very light at this point and overall she is healing well from this.  Has had UTIs in the past that have felt similar to this.     Past Medical History:  Diagnosis Date  . Anorexia nervosa, restricting type   . Anxiety   . Bulimia   . Depression   . Elevated cholesterol     Patient Active Problem List   Diagnosis Date Noted  . Encounter for induction of labor 09/22/2020  . SVD (spontaneous vaginal delivery) 09/22/2020  . Postpartum care following vaginal delivery 5/5 09/22/2020  . Excessive daytime sleepiness 10/17/2017  . Hypersomnia with long sleep time, idiopathic 10/17/2017  . Moderate episode of recurrent major depressive disorder (HCC) 12/27/2015  . Generalized anxiety disorder 12/27/2015  . Chronic idiopathic constipation 05/21/1993    Past Surgical History:  Procedure Laterality Date  . NO PAST SURGERIES      OB History    Gravida  3   Para  1   Term  1   Preterm      AB  2   Living  1     SAB  2   IAB      Ectopic      Multiple  0   Live Births  1            Home Medications    Prior to Admission medications   Medication Sig Start Date End Date Taking? Authorizing Provider  cephALEXin (KEFLEX) 500 MG capsule Take 1 capsule (500 mg total) by mouth 2 (two) times daily. 10/03/20  Yes Particia Nearing, PA-C  acetaminophen (TYLENOL) 325 MG tablet Take 2 tablets (650 mg total) by mouth every 4 (four) hours as needed (for pain  scale < 4). 09/24/20   Noland Fordyce, MD  betamethasone valerate ointment (VALISONE) 0.1 % Apply 1 application topically 2 (two) times daily. Patient not taking: Reported on 04/08/2020 12/08/19   Dahlia Byes A, NP  ibuprofen (ADVIL) 600 MG tablet Take 1 tablet (600 mg total) by mouth every 6 (six) hours. 09/24/20   Noland Fordyce, MD  Prenatal MV & Min w/FA-DHA (PRENATAL ADULT GUMMY/DHA/FA) 0.4-25 MG CHEW Chew by mouth.    [provider]  Specialty Vitamins Products (ADVANCED COLLAGEN) TABS Take 600 mg by mouth. Patient not taking: Reported on 04/08/2020    [provider]  vortioxetine HBr (TRINTELLIX) 20 MG TABS tablet Take 1 tablet (20 mg total) by mouth daily. Patient taking differently: Take 10 mg by mouth daily. Tapering off 10/22/17   Eksir, Bo Mcclintock, MD    Family History Family History  Problem Relation Age of Onset  . CAD Maternal Grandmother   . Colon cancer Maternal Grandfather   . Breast cancer Maternal Aunt   . Depression Mother   . Irritable bowel syndrome Mother   . Colon polyps Mother   . Anxiety disorder Brother   . Depression Brother  Social History Social History   Tobacco Use  . Smoking status: Never Smoker  . Smokeless tobacco: Never Used  Vaping Use  . Vaping Use: Never used  Substance Use Topics  . Alcohol use: Yes    Alcohol/week: 0.0 standard drinks    Comment: occasional   . Drug use: No     Allergies   Patient has no known allergies.   Review of Systems Review of Systems Per HPI  Physical Exam Triage Vital Signs ED Triage Vitals  Enc Vitals Group     BP 10/03/20 1525 104/67     Pulse Rate 10/03/20 1525 68     Resp 10/03/20 1525 16     Temp 10/03/20 1525 97.6 F (36.4 C)     Temp Source 10/03/20 1525 Oral     SpO2 10/03/20 1525 100 %     Weight --      Height --      Head Circumference --      Peak Flow --      Pain Score 10/03/20 1526 0     Pain Loc --      Pain Edu? --      Excl. in GC? --    No  data found.  Updated Vital Signs BP 104/67   Pulse 68   Temp 97.6 F (36.4 C) (Oral)   Resp 16   LMP 12/20/2019   SpO2 100%   Breastfeeding Yes   Visual Acuity Right Eye Distance:   Left Eye Distance:   Bilateral Distance:    Right Eye Near:   Left Eye Near:    Bilateral Near:     Physical Exam Vitals and nursing note reviewed.  Constitutional:      Appearance: Normal appearance. She is not ill-appearing.  HENT:     Head: Atraumatic.  Eyes:     Extraocular Movements: Extraocular movements intact.     Conjunctiva/sclera: Conjunctivae normal.  Cardiovascular:     Rate and Rhythm: Normal rate and regular rhythm.     Heart sounds: Normal heart sounds.  Pulmonary:     Effort: Pulmonary effort is normal.     Breath sounds: Normal breath sounds.  Abdominal:     General: Bowel sounds are normal. There is no distension.     Palpations: Abdomen is soft.     Tenderness: There is no abdominal tenderness. There is no right CVA tenderness, left CVA tenderness or guarding.  Musculoskeletal:        General: Normal range of motion.     Cervical back: Normal range of motion and neck supple.  Skin:    General: Skin is warm and dry.  Neurological:     Mental Status: She is alert and oriented to person, place, and time.  Psychiatric:        Mood and Affect: Mood normal.        Thought Content: Thought content normal.        Judgment: Judgment normal.      UC Treatments / Results  Labs (all labs ordered are listed, but only abnormal results are displayed) Labs Reviewed  POCT URINALYSIS DIPSTICK, ED / UC - Abnormal; Notable for the following components:      Result Value   Hgb urine dipstick SMALL (*)    Leukocytes,Ua MODERATE (*)    All other components within normal limits  URINE CULTURE    EKG   Radiology No results found.  Procedures Procedures (including critical care time)  Medications  Ordered in UC Medications - No data to display  Initial Impression /  Assessment and Plan / UC Course  I have reviewed the triage vital signs and the nursing notes.  Pertinent labs & imaging results that were available during my care of the patient were reviewed by me and considered in my medical decision making (see chart for details).     Exam and vitals benign and reassuring today, UA with moderate leukocytes.  Urine culture pending.  Will start Keflex and continue to push fluids in the meantime.  Follow-up if symptoms worsening  Final Clinical Impressions(s) / UC Diagnoses   Final diagnoses:  Acute lower UTI   Discharge Instructions   None    ED Prescriptions    Medication Sig Dispense Auth. Provider   cephALEXin (KEFLEX) 500 MG capsule Take 1 capsule (500 mg total) by mouth 2 (two) times daily. 10 capsule Particia Nearing, [place]     PDMP not reviewed this encounter.   Particia Nearing, [place] 10/03/20 [phone]

## 2020-10-03 NOTE — ED Triage Notes (Signed)
Pt in with c/o uti sxs.

## 2020-10-04 ENCOUNTER — Other Ambulatory Visit (HOSPITAL_COMMUNITY): Payer: Self-pay

## 2020-10-05 LAB — URINE CULTURE: Culture: >=100000 — AB

## 2020-10-11 DIAGNOSIS — R309 Painful micturition, unspecified: Secondary | ICD-10-CM | POA: Diagnosis not present

## 2020-11-07 DIAGNOSIS — Z124 Encounter for screening for malignant neoplasm of cervix: Secondary | ICD-10-CM | POA: Diagnosis not present

## 2020-12-06 DIAGNOSIS — F53 Postpartum depression: Secondary | ICD-10-CM | POA: Diagnosis not present

## 2020-12-15 DIAGNOSIS — F53 Postpartum depression: Secondary | ICD-10-CM | POA: Diagnosis not present

## 2020-12-20 DIAGNOSIS — F53 Postpartum depression: Secondary | ICD-10-CM | POA: Diagnosis not present

## 2021-01-03 DIAGNOSIS — F53 Postpartum depression: Secondary | ICD-10-CM | POA: Diagnosis not present

## 2021-01-10 DIAGNOSIS — F53 Postpartum depression: Secondary | ICD-10-CM | POA: Diagnosis not present

## 2021-01-24 DIAGNOSIS — F53 Postpartum depression: Secondary | ICD-10-CM | POA: Diagnosis not present

## 2021-02-07 DIAGNOSIS — F53 Postpartum depression: Secondary | ICD-10-CM | POA: Diagnosis not present

## 2021-02-20 ENCOUNTER — Other Ambulatory Visit (HOSPITAL_COMMUNITY): Payer: Self-pay

## 2021-02-20 DIAGNOSIS — F331 Major depressive disorder, recurrent, moderate: Secondary | ICD-10-CM | POA: Diagnosis not present

## 2021-02-20 DIAGNOSIS — F411 Generalized anxiety disorder: Secondary | ICD-10-CM | POA: Diagnosis not present

## 2021-02-20 MED ORDER — TRINTELLIX 20 MG PO TABS
ORAL_TABLET | ORAL | 3 refills | Status: DC
  Filled 2021-02-20: qty 90, 90d supply, fill #0

## 2021-02-21 ENCOUNTER — Other Ambulatory Visit (HOSPITAL_COMMUNITY): Payer: Self-pay

## 2021-02-22 DIAGNOSIS — F53 Postpartum depression: Secondary | ICD-10-CM | POA: Diagnosis not present

## 2021-03-07 DIAGNOSIS — F53 Postpartum depression: Secondary | ICD-10-CM | POA: Diagnosis not present

## 2021-03-20 DIAGNOSIS — F411 Generalized anxiety disorder: Secondary | ICD-10-CM | POA: Diagnosis not present

## 2021-03-20 DIAGNOSIS — F331 Major depressive disorder, recurrent, moderate: Secondary | ICD-10-CM | POA: Diagnosis not present

## 2021-03-20 DIAGNOSIS — F53 Postpartum depression: Secondary | ICD-10-CM | POA: Diagnosis not present

## 2021-04-03 DIAGNOSIS — F53 Postpartum depression: Secondary | ICD-10-CM | POA: Diagnosis not present

## 2021-04-20 DIAGNOSIS — F53 Postpartum depression: Secondary | ICD-10-CM | POA: Diagnosis not present

## 2021-05-04 DIAGNOSIS — F53 Postpartum depression: Secondary | ICD-10-CM | POA: Diagnosis not present

## 2021-05-09 DIAGNOSIS — F53 Postpartum depression: Secondary | ICD-10-CM | POA: Diagnosis not present

## 2021-05-18 DIAGNOSIS — F53 Postpartum depression: Secondary | ICD-10-CM | POA: Diagnosis not present

## 2021-05-24 DIAGNOSIS — F53 Postpartum depression: Secondary | ICD-10-CM | POA: Diagnosis not present

## 2021-05-25 DIAGNOSIS — F4322 Adjustment disorder with anxiety: Secondary | ICD-10-CM | POA: Diagnosis not present

## 2021-05-31 DIAGNOSIS — F53 Postpartum depression: Secondary | ICD-10-CM | POA: Diagnosis not present

## 2021-06-07 DIAGNOSIS — F53 Postpartum depression: Secondary | ICD-10-CM | POA: Diagnosis not present

## 2021-06-13 DIAGNOSIS — F4322 Adjustment disorder with anxiety: Secondary | ICD-10-CM | POA: Diagnosis not present

## 2021-06-22 DIAGNOSIS — F53 Postpartum depression: Secondary | ICD-10-CM | POA: Diagnosis not present

## 2021-06-27 DIAGNOSIS — F4322 Adjustment disorder with anxiety: Secondary | ICD-10-CM | POA: Diagnosis not present

## 2021-07-05 DIAGNOSIS — F53 Postpartum depression: Secondary | ICD-10-CM | POA: Diagnosis not present

## 2021-07-17 DIAGNOSIS — F53 Postpartum depression: Secondary | ICD-10-CM | POA: Diagnosis not present

## 2021-07-24 DIAGNOSIS — F53 Postpartum depression: Secondary | ICD-10-CM | POA: Diagnosis not present

## 2021-08-02 DIAGNOSIS — F53 Postpartum depression: Secondary | ICD-10-CM | POA: Diagnosis not present

## 2021-08-14 DIAGNOSIS — F53 Postpartum depression: Secondary | ICD-10-CM | POA: Diagnosis not present

## 2021-09-04 DIAGNOSIS — F53 Postpartum depression: Secondary | ICD-10-CM | POA: Diagnosis not present

## 2021-09-13 DIAGNOSIS — M62838 Other muscle spasm: Secondary | ICD-10-CM | POA: Diagnosis not present

## 2021-09-13 DIAGNOSIS — R278 Other lack of coordination: Secondary | ICD-10-CM | POA: Diagnosis not present

## 2021-09-13 DIAGNOSIS — N393 Stress incontinence (female) (male): Secondary | ICD-10-CM | POA: Diagnosis not present

## 2021-09-18 DIAGNOSIS — F53 Postpartum depression: Secondary | ICD-10-CM | POA: Diagnosis not present

## 2021-09-20 DIAGNOSIS — N393 Stress incontinence (female) (male): Secondary | ICD-10-CM | POA: Diagnosis not present

## 2021-09-20 DIAGNOSIS — R278 Other lack of coordination: Secondary | ICD-10-CM | POA: Diagnosis not present

## 2021-09-20 DIAGNOSIS — M62838 Other muscle spasm: Secondary | ICD-10-CM | POA: Diagnosis not present

## 2021-09-28 DIAGNOSIS — M62838 Other muscle spasm: Secondary | ICD-10-CM | POA: Diagnosis not present

## 2021-09-28 DIAGNOSIS — R278 Other lack of coordination: Secondary | ICD-10-CM | POA: Diagnosis not present

## 2021-09-28 DIAGNOSIS — N393 Stress incontinence (female) (male): Secondary | ICD-10-CM | POA: Diagnosis not present

## 2021-10-02 DIAGNOSIS — F53 Postpartum depression: Secondary | ICD-10-CM | POA: Diagnosis not present

## 2021-10-18 DIAGNOSIS — F53 Postpartum depression: Secondary | ICD-10-CM | POA: Diagnosis not present

## 2021-11-13 DIAGNOSIS — F53 Postpartum depression: Secondary | ICD-10-CM | POA: Diagnosis not present

## 2021-11-24 DIAGNOSIS — F53 Postpartum depression: Secondary | ICD-10-CM | POA: Diagnosis not present

## 2021-12-07 DIAGNOSIS — F53 Postpartum depression: Secondary | ICD-10-CM | POA: Diagnosis not present

## 2021-12-14 DIAGNOSIS — F411 Generalized anxiety disorder: Secondary | ICD-10-CM | POA: Diagnosis not present

## 2021-12-14 DIAGNOSIS — F331 Major depressive disorder, recurrent, moderate: Secondary | ICD-10-CM | POA: Diagnosis not present

## 2021-12-14 DIAGNOSIS — Z0000 Encounter for general adult medical examination without abnormal findings: Secondary | ICD-10-CM | POA: Diagnosis not present

## 2021-12-14 DIAGNOSIS — F5000 Anorexia nervosa, unspecified: Secondary | ICD-10-CM | POA: Diagnosis not present

## 2022-01-04 DIAGNOSIS — F53 Postpartum depression: Secondary | ICD-10-CM | POA: Diagnosis not present

## 2022-01-07 ENCOUNTER — Ambulatory Visit (INDEPENDENT_AMBULATORY_CARE_PROVIDER_SITE_OTHER): Payer: 59

## 2022-01-07 ENCOUNTER — Ambulatory Visit
Admission: RE | Admit: 2022-01-07 | Discharge: 2022-01-07 | Disposition: A | Discharge status: Home / Self Care | Payer: 59 | Source: Ambulatory Visit | Attending: Emergency Medicine | Admitting: Emergency Medicine

## 2022-01-07 VITALS — BP 115/76 | HR 78 | Temp 97.9°F | Resp 17

## 2022-01-07 DIAGNOSIS — R109 Unspecified abdominal pain: Secondary | ICD-10-CM | POA: Diagnosis not present

## 2022-01-07 DIAGNOSIS — R5381 Other malaise: Secondary | ICD-10-CM | POA: Diagnosis not present

## 2022-01-07 DIAGNOSIS — R310 Gross hematuria: Secondary | ICD-10-CM | POA: Diagnosis not present

## 2022-01-07 DIAGNOSIS — Z8616 Personal history of COVID-19: Secondary | ICD-10-CM | POA: Diagnosis not present

## 2022-01-07 DIAGNOSIS — J029 Acute pharyngitis, unspecified: Secondary | ICD-10-CM | POA: Diagnosis not present

## 2022-01-07 DIAGNOSIS — N200 Calculus of kidney: Secondary | ICD-10-CM | POA: Insufficient documentation

## 2022-01-07 DIAGNOSIS — R5383 Other fatigue: Secondary | ICD-10-CM | POA: Diagnosis not present

## 2022-01-07 DIAGNOSIS — Z20822 Contact with and (suspected) exposure to covid-19: Secondary | ICD-10-CM | POA: Insufficient documentation

## 2022-01-07 DIAGNOSIS — R809 Proteinuria, unspecified: Secondary | ICD-10-CM | POA: Diagnosis not present

## 2022-01-07 LAB — POCT URINALYSIS DIP (MANUAL ENTRY)
Bilirubin, UA: NEGATIVE
Glucose, UA: NEGATIVE mg/dL
Ketones, POC UA: NEGATIVE mg/dL
Leukocytes, UA: NEGATIVE
Nitrite, UA: NEGATIVE
Protein Ur, POC: 100 mg/dL — AB
Spec Grav, UA: 1.015 (ref 1.010–1.025)
Urobilinogen, UA: 0.2 E.U./dL
pH, UA: 7 (ref 5.0–8.0)

## 2022-01-07 LAB — URINALYSIS, MICROSCOPIC (REFLEX): RBC / HPF: >50 RBC/hpf (ref 0–5)

## 2022-01-07 LAB — RESP PANEL BY RT-PCR (FLU A&B, COVID) ARPGX2
Influenza A by PCR: NEGATIVE
Influenza B by PCR: NEGATIVE
SARS Coronavirus 2 by RT PCR: NEGATIVE

## 2022-01-07 LAB — URINALYSIS, ROUTINE W REFLEX MICROSCOPIC
Bilirubin Urine: NEGATIVE
Glucose, UA: NEGATIVE mg/dL
Ketones, ur: NEGATIVE mg/dL
Nitrite: NEGATIVE
Protein, ur: 30 mg/dL — AB
Specific Gravity, Urine: 1.01 (ref 1.005–1.030)
pH: 7 (ref 5.0–8.0)

## 2022-01-07 LAB — POCT RAPID STREP A (OFFICE): Rapid Strep A Screen: NEGATIVE

## 2022-01-07 MED ORDER — IBUPROFEN 600 MG PO TABS
600.0000 mg | ORAL_TABLET | Freq: Four times a day (QID) | ORAL | 0 refills | Status: DC | PRN
  Filled 2022-01-07: qty 30, 8d supply, fill #0

## 2022-01-07 NOTE — ED Provider Notes (Signed)
HPI  SUBJECTIVE:  Brooke Fernandez is a 36 y.o. female who presents with hematuria starting yesterday.  She reports intermittent, achy, pelvic pain located primarily along the sacrum, chills, sore throat, headache, body aches, postnasal drip, and a sensation of feeling drained.  She states that she has been holding a heavy toddler a lot recently and wonders if this could be the cause of her sacral pain.  No urinary complaints, back pain, flank pain, nausea, vomiting, fevers, vaginal odor, discharge, genital rash, nasal congestion, rhinorrhea, cough, wheezing, nausea, vomiting, diarrhea, abdominal pain.  She had shortness of breath 2 days ago, but this has since resolved.  No drooling, trismus, sensation of throat swelling shut, difficulty breathing, neck stiffness, voice changes.  No lower extremity edema.  No tick bites.  No rash.  No known COVID, flu, strep exposure.  She got 3 doses of the COVID-vaccine.  She is in a long-term monogamous relationship with her husband, who is asymptomatic.  STDs are not a concern today.  No antibiotics in the past month. No Antipyretic in the past 6 hours.  She tried Tylenol and ibuprofen without improvement in her symptoms.  Symptoms worse with standing.  She had a past medical history of COVID in December 22.  No history of nephrolithiasis.  Family history negative for nephrolithiasis.  LMP: Now.  Patient has been in her usual state of health up until a few days ago.  She denies recent heavy exercise  Past Medical History:  Diagnosis Date   Anorexia nervosa, restricting type    Anxiety    Bulimia    Depression    Elevated cholesterol     Past Surgical History:  Procedure Laterality Date   NO PAST SURGERIES      Family History  Problem Relation Age of Onset   CAD Maternal Grandmother    Colon cancer Maternal Grandfather    Breast cancer Maternal Aunt    Depression Mother    Irritable bowel syndrome Mother    Colon polyps Mother    Anxiety disorder  Brother    Depression Brother     Social History   Tobacco Use   Smoking status: Never   Smokeless tobacco: Never  Vaping Use   Vaping Use: Never used  Substance Use Topics   Alcohol use: Yes    Alcohol/week: 0.0 standard drinks of alcohol    Comment: occasional    Drug use: No    No current facility-administered medications for this encounter.  Current Outpatient Medications:    ibuprofen (ADVIL) 600 MG tablet, Take 1 tablet (600 mg total) by mouth every 6 (six) hours as needed., Disp: 30 tablet, Rfl: 0   acetaminophen (TYLENOL) 325 MG tablet, Take 2 tablets (650 mg total) by mouth every 4 (four) hours as needed (for pain scale < 4)., Disp: 60 tablet, Rfl: 1   Prenatal MV & Min w/FA-DHA (PRENATAL ADULT GUMMY/DHA/FA) 0.4-25 MG CHEW, Chew by mouth., Disp: , Rfl:    Specialty Vitamins Products (ADVANCED COLLAGEN) TABS, Take 600 mg by mouth. (Patient not taking: Reported on 04/08/2020), Disp: , Rfl:    vortioxetine HBr (TRINTELLIX) 20 MG TABS tablet, Take 1 tablet (20 mg total) by mouth daily. (Patient taking differently: Take 10 mg by mouth daily. Tapering off), Disp: 90 tablet, Rfl: 1   vortioxetine HBr (TRINTELLIX) 20 MG TABS tablet, Take one half tablet (10 mg dose) by mouth daily for 7 days, THEN take one tablet (20 mg dose) daily., Disp: 90 tablet, Rfl: 3  No Known Allergies   ROS  As noted in HPI.   Physical Exam  BP 115/76 (BP Location: Left Arm)   Pulse 78   Temp 97.9 F (36.6 C) (Oral)   Resp 17   LMP 01/02/2022   SpO2 98%   Breastfeeding No   Constitutional: Well developed, well nourished, no acute distress Eyes: PERRL, EOMI, conjunctiva normal bilaterally HENT: Normocephalic, atraumatic,mucus membranes moist.  Erythematous oropharynx.  Tonsils normal size without exudates.  Uvula midline. neck: Positive cervical lymphadenopathy Respiratory: Clear to auscultation bilaterally, no rales, no wheezing, no rhonchi Cardiovascular: Normal rate and rhythm, no  murmurs, no gallops, no rubs GI: Soft, nondistended, normal bowel sounds, nontender, no rebound, no guarding Back: no CVAT.  Positive tenderness at the QL bilaterally.  No sacral, SI joint tenderness.  No paralumbar tenderness skin: No rash, skin intact Musculoskeletal: No edema, no tenderness, no deformities Neurologic: Alert & oriented x 3, CN III-XII grossly intact, no motor deficits, sensation grossly intact Psychiatric: Speech and behavior appropriate   ED Course   Medications - No data to display  Orders Placed This Encounter  Procedures   Resp Panel by RT-PCR (Flu A&B, Covid) Anterior Nasal Swab    Standing Status:   Standing    Number of Occurrences:   1   Culture, group A strep    Standing Status:   Standing    Number of Occurrences:   1   DG Abd 1 View    Standing Status:   Standing    Number of Occurrences:   1    Order Specific Question:   Reason for Exam (SYMPTOM  OR DIAGNOSIS REQUIRED)    Answer:   Pelvic pain, hematuria rule out nephrolithiasis   Comprehensive metabolic panel    Standing Status:   Standing    Number of Occurrences:   1   CBC    Standing Status:   Standing    Number of Occurrences:   1   Urinalysis, Routine w reflex microscopic Urine, Clean Catch    Standing Status:   Standing    Number of Occurrences:   1   Urinalysis, Microscopic (reflex)    Standing Status:   Standing    Number of Occurrences:   1   POCT urinalysis dipstick    Standing Status:   Standing    Number of Occurrences:   1   POCT rapid strep A    Standing Status:   Standing    Number of Occurrences:   1   No results found for this or any previous visit (from the past 24 hour(s)).  DG Abd 1 View  Result Date: 01/07/2022 CLINICAL DATA:  Pelvic pain.  Hematuria.  Evaluate for kidney stone. EXAM: ABDOMEN - 1 VIEW COMPARISON:  06/02/2019 FINDINGS: There is a moderate stool burden identified throughout the colon and rectum. No pathologically dilated loops of large or small  bowel. Extensive bowel gas and stool overlies both kidneys, both ureters and the urinary bladder. This diminishes sensitivity for identifying underlying kidney stones. Within this limitation no abnormal abdominal or pelvic calcifications confidently identified. IMPRESSION: 1. Moderate stool burden within the colon and rectum suggests constipation. 2. No renal calculi identified. Electronically Signed   By: Signa Kell M.D.   On: 01/07/2022 12:35    ED Clinical Impression  1. Gross hematuria   2. Malaise and fatigue   3. Sore throat      ED Assessment/Plan  Patient has large hematuria and proteinuria, however  she is on menses.  Difficult to tell if this is menstrual blood or true hematuria.  Will check strep, COVID/flu, UA with micro looking for casts, KUB, CBC, CMP.  Discussed with patient that this will take several days for results.  Reviewed imaging independently.  No nephrolithiasis.  Moderate stool burden.  See radiology report for full details.  Rapid strep negative.  Sending throat culture off.  Imaging negative for nephrolithiasis.  Lab work will be back in several days.  In the meantime, Benadryl/Maalox mixture, Flonase, saline nasal irrigation for the postnasal drip.  Will consider antivirals, but we will discuss that pending lab results.  CBC, CMP normal, COVID, flu negative.  Throat culture pending at the time of signing of this note.  Discussed labs, imaging, MDM, treatment plan, and plan for follow-up with patient Discussed sn/sx that should prompt return to the ED. patient agrees with plan.   Meds ordered this encounter  Medications   ibuprofen (ADVIL) 600 MG tablet    Sig: Take 1 tablet (600 mg total) by mouth every 6 (six) hours as needed.    Dispense:  30 tablet    Refill:  0      *This clinic note was created using Scientist, clinical (histocompatibility and immunogenetics). Therefore, there may be occasional mistakes despite careful proofreading. ?'    Domenick Gong, MD 01/09/22  [phone]

## 2022-01-07 NOTE — ED Triage Notes (Signed)
Pt presents with hematuria, fatigue, headache, lower back pain, and pelvic pain X 2 days.

## 2022-01-07 NOTE — Discharge Instructions (Addendum)
KUB was negative for stone.  However does not pick up all stones.  It did show moderate stool burden suggestive of constipation, which could be causing some pelvic/back pain.  Will prescribe molnupiravir if your COVID is positive, Tamiflu for flu is positive.  We will have more information with your labs in several days.

## 2022-01-08 ENCOUNTER — Other Ambulatory Visit (HOSPITAL_COMMUNITY): Payer: Self-pay

## 2022-01-09 LAB — CBC
Hematocrit: 39.1 % (ref 34.0–46.6)
Hemoglobin: 12.8 g/dL (ref 11.1–15.9)
MCH: 31 pg (ref 26.6–33.0)
MCHC: 32.7 g/dL (ref 31.5–35.7)
MCV: 95 fL (ref 79–97)
Platelets: 288 10*3/uL (ref 150–450)
RBC: 4.13 x10E6/uL (ref 3.77–5.28)
RDW: 11.9 % (ref 11.7–15.4)
WBC: 10.4 10*3/uL (ref 3.4–10.8)

## 2022-01-09 LAB — COMPREHENSIVE METABOLIC PANEL
ALT: 19 IU/L (ref 0–32)
AST: 20 IU/L (ref 0–40)
Albumin/Globulin Ratio: 2 (ref 1.2–2.2)
Albumin: 5 g/dL — ABNORMAL HIGH (ref 3.9–4.9)
Alkaline Phosphatase: 75 IU/L (ref 44–121)
BUN/Creatinine Ratio: 12 (ref 9–23)
BUN: 8 mg/dL (ref 6–20)
Bilirubin Total: <0.2 mg/dL (ref 0.0–1.2)
CO2: 22 mmol/L (ref 20–29)
Calcium: 9.7 mg/dL (ref 8.7–10.2)
Chloride: 101 mmol/L (ref 96–106)
Creatinine, Ser: 0.65 mg/dL (ref 0.57–1.00)
Globulin, Total: 2.5 g/dL (ref 1.5–4.5)
Glucose: 78 mg/dL (ref 70–99)
Potassium: 3.8 mmol/L (ref 3.5–5.2)
Sodium: 141 mmol/L (ref 134–144)
Total Protein: 7.5 g/dL (ref 6.0–8.5)
eGFR: 119 mL/min/{1.73_m2} (ref >59)

## 2022-01-10 LAB — CULTURE, GROUP A STREP (THRC)

## 2022-01-16 DIAGNOSIS — F53 Postpartum depression: Secondary | ICD-10-CM | POA: Diagnosis not present

## 2022-01-31 ENCOUNTER — Telehealth: Payer: Self-pay | Admitting: Family Medicine

## 2022-01-31 ENCOUNTER — Other Ambulatory Visit (HOSPITAL_COMMUNITY): Payer: Self-pay

## 2022-01-31 MED ORDER — CEPHALEXIN 500 MG PO CAPS
500.0000 mg | ORAL_CAPSULE | Freq: Two times a day (BID) | ORAL | 0 refills | Status: DC
  Filled 2022-01-31: qty 14, 7d supply, fill #0

## 2022-01-31 NOTE — Telephone Encounter (Signed)
Patient called today complaining of warm, swollen, red area to the left arm after shaving 3 days ago.  Concern for cellulitis.  Will call in Keflex, continue good home wound care, mupirocin ointment and follow-up for worsening symptoms.

## 2022-02-01 DIAGNOSIS — F53 Postpartum depression: Secondary | ICD-10-CM | POA: Diagnosis not present

## 2022-02-05 DIAGNOSIS — L03115 Cellulitis of right lower limb: Secondary | ICD-10-CM | POA: Diagnosis not present

## 2022-02-05 DIAGNOSIS — Z23 Encounter for immunization: Secondary | ICD-10-CM | POA: Diagnosis not present

## 2022-02-13 DIAGNOSIS — F53 Postpartum depression: Secondary | ICD-10-CM | POA: Diagnosis not present

## 2022-02-19 DIAGNOSIS — F4322 Adjustment disorder with anxiety: Secondary | ICD-10-CM | POA: Diagnosis not present

## 2022-02-22 DIAGNOSIS — F53 Postpartum depression: Secondary | ICD-10-CM | POA: Diagnosis not present

## 2022-03-15 DIAGNOSIS — F53 Postpartum depression: Secondary | ICD-10-CM | POA: Diagnosis not present

## 2022-03-23 DIAGNOSIS — Z8759 Personal history of other complications of pregnancy, childbirth and the puerperium: Secondary | ICD-10-CM | POA: Diagnosis not present

## 2022-03-23 DIAGNOSIS — Z3200 Encounter for pregnancy test, result unknown: Secondary | ICD-10-CM | POA: Diagnosis not present

## 2022-03-23 LAB — OB RESULTS CONSOLE ABO/RH: RH Type: POSITIVE

## 2022-03-23 LAB — OB RESULTS CONSOLE ANTIBODY SCREEN: Antibody Screen: NEGATIVE

## 2022-03-27 DIAGNOSIS — F53 Postpartum depression: Secondary | ICD-10-CM | POA: Diagnosis not present

## 2022-03-27 DIAGNOSIS — Z8759 Personal history of other complications of pregnancy, childbirth and the puerperium: Secondary | ICD-10-CM | POA: Diagnosis not present

## 2022-03-29 DIAGNOSIS — F4322 Adjustment disorder with anxiety: Secondary | ICD-10-CM | POA: Diagnosis not present

## 2022-04-10 DIAGNOSIS — F53 Postpartum depression: Secondary | ICD-10-CM | POA: Diagnosis not present

## 2022-04-11 DIAGNOSIS — Z3201 Encounter for pregnancy test, result positive: Secondary | ICD-10-CM | POA: Diagnosis not present

## 2022-05-01 DIAGNOSIS — F53 Postpartum depression: Secondary | ICD-10-CM | POA: Diagnosis not present

## 2022-05-04 DIAGNOSIS — Z3689 Encounter for other specified antenatal screening: Secondary | ICD-10-CM | POA: Diagnosis not present

## 2022-05-04 DIAGNOSIS — Z3481 Encounter for supervision of other normal pregnancy, first trimester: Secondary | ICD-10-CM | POA: Diagnosis not present

## 2022-05-04 LAB — OB RESULTS CONSOLE GC/CHLAMYDIA
Chlamydia: NEGATIVE
Neisseria Gonorrhea: NEGATIVE

## 2022-05-04 LAB — OB RESULTS CONSOLE RUBELLA ANTIBODY, IGM: Rubella: IMMUNE

## 2022-05-04 LAB — OB RESULTS CONSOLE HIV ANTIBODY (ROUTINE TESTING): HIV: NONREACTIVE

## 2022-05-04 LAB — OB RESULTS CONSOLE RPR: RPR: NONREACTIVE

## 2022-05-04 LAB — OB RESULTS CONSOLE HEPATITIS B SURFACE ANTIGEN: Hepatitis B Surface Ag: NEGATIVE

## 2022-05-04 LAB — HEPATITIS C ANTIBODY: HCV Ab: NEGATIVE

## 2022-05-08 DIAGNOSIS — F53 Postpartum depression: Secondary | ICD-10-CM | POA: Diagnosis not present

## 2022-05-21 NOTE — L&D Delivery Note (Signed)
Delivery Note At 8:04 PM a viable female was delivered via Vaginal, Spontaneous (Presentation:   Occiput Anterior).  APGAR: 7, 9; weight  pending.   Placenta status: Spontaneous, Intact.  Cord: 3 vessels with the following complications:  .  Cord pH: n/a  Anesthesia: Epidural Episiotomy: None Lacerations: None Suture Repair:  n/a Est. Blood Loss (mL):  150cc  Mom to postpartum.  Baby to Couplet care / Skin to Skin.  Lendon Colonel 12/05/2022, 8:23 PM

## 2022-05-24 DIAGNOSIS — F53 Postpartum depression: Secondary | ICD-10-CM | POA: Diagnosis not present

## 2022-05-29 DIAGNOSIS — Z3689 Encounter for other specified antenatal screening: Secondary | ICD-10-CM | POA: Diagnosis not present

## 2022-05-31 DIAGNOSIS — F4321 Adjustment disorder with depressed mood: Secondary | ICD-10-CM | POA: Diagnosis not present

## 2022-06-05 DIAGNOSIS — F4321 Adjustment disorder with depressed mood: Secondary | ICD-10-CM | POA: Diagnosis not present

## 2022-06-14 DIAGNOSIS — F4321 Adjustment disorder with depressed mood: Secondary | ICD-10-CM | POA: Diagnosis not present

## 2022-06-26 DIAGNOSIS — Z363 Encounter for antenatal screening for malformations: Secondary | ICD-10-CM | POA: Diagnosis not present

## 2022-06-26 DIAGNOSIS — Z3482 Encounter for supervision of other normal pregnancy, second trimester: Secondary | ICD-10-CM | POA: Diagnosis not present

## 2022-06-26 DIAGNOSIS — Z361 Encounter for antenatal screening for raised alphafetoprotein level: Secondary | ICD-10-CM | POA: Diagnosis not present

## 2022-06-28 DIAGNOSIS — F4321 Adjustment disorder with depressed mood: Secondary | ICD-10-CM | POA: Diagnosis not present

## 2022-07-03 DIAGNOSIS — F4321 Adjustment disorder with depressed mood: Secondary | ICD-10-CM | POA: Diagnosis not present

## 2022-07-05 DIAGNOSIS — Z3483 Encounter for supervision of other normal pregnancy, third trimester: Secondary | ICD-10-CM | POA: Diagnosis not present

## 2022-07-05 DIAGNOSIS — Z3482 Encounter for supervision of other normal pregnancy, second trimester: Secondary | ICD-10-CM | POA: Diagnosis not present

## 2022-07-12 DIAGNOSIS — F4321 Adjustment disorder with depressed mood: Secondary | ICD-10-CM | POA: Diagnosis not present

## 2022-07-26 ENCOUNTER — Other Ambulatory Visit (HOSPITAL_COMMUNITY): Payer: Self-pay

## 2022-07-26 MED ORDER — TRINTELLIX 10 MG PO TABS
10.0000 mg | ORAL_TABLET | Freq: Every day | ORAL | 1 refills | Status: DC
  Filled 2022-07-26: qty 90, 90d supply, fill #0

## 2022-07-30 DIAGNOSIS — F4321 Adjustment disorder with depressed mood: Secondary | ICD-10-CM | POA: Diagnosis not present

## 2022-07-31 ENCOUNTER — Other Ambulatory Visit (HOSPITAL_COMMUNITY): Payer: Self-pay

## 2022-08-23 ENCOUNTER — Other Ambulatory Visit (HOSPITAL_COMMUNITY): Payer: Self-pay

## 2022-08-23 DIAGNOSIS — Z3482 Encounter for supervision of other normal pregnancy, second trimester: Secondary | ICD-10-CM | POA: Diagnosis not present

## 2022-08-23 DIAGNOSIS — Z8759 Personal history of other complications of pregnancy, childbirth and the puerperium: Secondary | ICD-10-CM | POA: Diagnosis not present

## 2022-08-23 DIAGNOSIS — K219 Gastro-esophageal reflux disease without esophagitis: Secondary | ICD-10-CM | POA: Diagnosis not present

## 2022-08-23 DIAGNOSIS — Z3689 Encounter for other specified antenatal screening: Secondary | ICD-10-CM | POA: Diagnosis not present

## 2022-08-23 DIAGNOSIS — Z3A26 26 weeks gestation of pregnancy: Secondary | ICD-10-CM | POA: Diagnosis not present

## 2022-08-23 MED ORDER — FAMOTIDINE 20 MG PO TABS
20.0000 mg | ORAL_TABLET | Freq: Two times a day (BID) | ORAL | 1 refills | Status: DC
  Filled 2022-08-23: qty 60, 30d supply, fill #0

## 2022-08-24 ENCOUNTER — Other Ambulatory Visit (HOSPITAL_COMMUNITY): Payer: Self-pay

## 2022-08-28 DIAGNOSIS — F4321 Adjustment disorder with depressed mood: Secondary | ICD-10-CM | POA: Diagnosis not present

## 2022-09-04 DIAGNOSIS — F4322 Adjustment disorder with anxiety: Secondary | ICD-10-CM | POA: Diagnosis not present

## 2022-09-10 DIAGNOSIS — F4321 Adjustment disorder with depressed mood: Secondary | ICD-10-CM | POA: Diagnosis not present

## 2022-09-11 ENCOUNTER — Other Ambulatory Visit (HOSPITAL_COMMUNITY): Payer: Self-pay

## 2022-09-11 MED ORDER — SERTRALINE HCL 25 MG PO TABS
25.0000 mg | ORAL_TABLET | Freq: Every day | ORAL | 0 refills | Status: DC
Start: 2022-09-11 — End: 2022-10-25
  Filled 2022-09-11: qty 90, 90d supply, fill #0

## 2022-09-18 ENCOUNTER — Other Ambulatory Visit (HOSPITAL_COMMUNITY): Payer: Self-pay

## 2022-09-18 DIAGNOSIS — K219 Gastro-esophageal reflux disease without esophagitis: Secondary | ICD-10-CM | POA: Diagnosis not present

## 2022-09-18 DIAGNOSIS — Z3A30 30 weeks gestation of pregnancy: Secondary | ICD-10-CM | POA: Diagnosis not present

## 2022-09-18 DIAGNOSIS — O26849 Uterine size-date discrepancy, unspecified trimester: Secondary | ICD-10-CM | POA: Diagnosis not present

## 2022-09-18 DIAGNOSIS — O26893 Other specified pregnancy related conditions, third trimester: Secondary | ICD-10-CM | POA: Diagnosis not present

## 2022-09-18 DIAGNOSIS — Z23 Encounter for immunization: Secondary | ICD-10-CM | POA: Diagnosis not present

## 2022-09-18 DIAGNOSIS — Z3689 Encounter for other specified antenatal screening: Secondary | ICD-10-CM | POA: Diagnosis not present

## 2022-09-18 MED ORDER — PANTOPRAZOLE SODIUM 40 MG PO TBEC
DELAYED_RELEASE_TABLET | ORAL | 1 refills | Status: DC
  Filled 2022-09-18: qty 90, 90d supply, fill #0

## 2022-09-19 ENCOUNTER — Other Ambulatory Visit (HOSPITAL_COMMUNITY): Payer: Self-pay

## 2022-09-20 DIAGNOSIS — F4321 Adjustment disorder with depressed mood: Secondary | ICD-10-CM | POA: Diagnosis not present

## 2022-09-27 DIAGNOSIS — F4322 Adjustment disorder with anxiety: Secondary | ICD-10-CM | POA: Diagnosis not present

## 2022-10-09 DIAGNOSIS — F4321 Adjustment disorder with depressed mood: Secondary | ICD-10-CM | POA: Diagnosis not present

## 2022-10-16 DIAGNOSIS — F4322 Adjustment disorder with anxiety: Secondary | ICD-10-CM | POA: Diagnosis not present

## 2022-10-25 ENCOUNTER — Ambulatory Visit (INDEPENDENT_AMBULATORY_CARE_PROVIDER_SITE_OTHER): Payer: Commercial Managed Care - PPO | Admitting: Dermatology

## 2022-10-25 ENCOUNTER — Encounter: Payer: Self-pay | Admitting: Dermatology

## 2022-10-25 VITALS — BP 99/65

## 2022-10-25 DIAGNOSIS — L980 Pyogenic granuloma: Secondary | ICD-10-CM | POA: Diagnosis not present

## 2022-10-25 NOTE — Patient Instructions (Signed)
Due to recent changes in healthcare laws, you may see results of your pathology and/or laboratory studies on MyChart before the doctors have had a chance to review them. We understand that in some cases there may be results that are confusing or concerning to you. Please understand that not all results are received at the same time and often the doctors may need to interpret multiple results in order to provide you with the best plan of care or course of treatment. Therefore, we ask that you please give us 2 business days to thoroughly review all your results before contacting the office for clarification. Should we see a critical lab result, you will be contacted sooner.   If You Need Anything After Your Visit  If you have any questions or concerns for your doctor, please call our main line at [phone] If no one answers, please leave a voicemail as directed and we will return your call as soon as possible. Messages left after 4 pm will be answered the following business day.   You may also send us a message via MyChart. We typically respond to MyChart messages within 1-2 business days.  For prescription refills, please ask your pharmacy to contact our office. Our fax number is [phone].  If you have an urgent issue when the clinic is closed that cannot wait until the next business day, you can page your doctor at the number below.    Please note that while we do our best to be available for urgent issues outside of office hours, we are not available 24/7.   If you have an urgent issue and are unable to reach us, you may choose to seek medical care at your doctor's office, retail clinic, urgent care center, or emergency room.  If you have a medical emergency, please immediately call 911 or go to the emergency department. In the event of inclement weather, please call our main line at [phone] for an update on the status of any delays or closures.  Dermatology Medication Tips: Please  keep the boxes that topical medications come in in order to help keep track of the instructions about where and how to use these. Pharmacies typically print the medication instructions only on the boxes and not directly on the medication tubes.   If your medication is too expensive, please contact our office at [phone] or send us a message through MyChart.   We are unable to tell what your co-pay for medications will be in advance as this is different depending on your insurance coverage. However, we may be able to find a substitute medication at lower cost or fill out paperwork to get insurance to cover a needed medication.   If a prior authorization is required to get your medication covered by your insurance company, please allow us 1-2 business days to complete this process.  Drug prices often vary depending on where the prescription is filled and some pharmacies may offer cheaper prices.  The website www.goodrx.com contains coupons for medications through different pharmacies. The prices here do not account for what the cost may be with help from insurance (it may be cheaper with your insurance), but the website can give you the price if you did not use any insurance.  - You can print the associated coupon and take it with your prescription to the pharmacy.  - You may also stop by our office during regular business hours and pick up a GoodRx coupon card.  - If you need your   prescription sent electronically to a different pharmacy, notify our office through [facility] MyChart or by phone at [phone]     

## 2022-10-25 NOTE — Progress Notes (Signed)
   New Patient Visit   Subjective  Brooke Fernandez is a 37 y.o. female who presents for the following: lesion on the lower lip x 2 months. It was small like a pimple and has grown. It cracks and bleeds. She is [redacted] weeks pregnant. No personal history of skin cancer.   The following portions of the chart were reviewed this encounter and updated as appropriate: medications, allergies, medical history  Review of Systems:  No other skin or systemic complaints except as noted in HPI or Assessment and Plan.  Objective  Well appearing patient in no apparent distress; mood and affect are within normal limits.   A focused examination was performed of the following areas: Lips, face  Relevant exam findings are noted in the Assessment and Plan.    Assessment & Plan   Pyogenic Granuloma Exam: 6mm friable vascular papule  Treatment Plan:   This a benign growth of blood vessels. Secondary to what she thought was a pimple. Discussed treatment options including cryotherapy today, or cauterizing the lesion after delivery. It is advised that she return after the pregnancy if she desires it be cauterized because there are risks associated with use of lidocaine in pregnancy. Cosmetically cauterizing it is the best option. It is possible that it may need to be treated more than once. Cryotherapy may not fully resolve the lesion and she was informed that it may have to be treated a few times. There is a possibility of a whitish scar after treatment with LN2.  The patient prefers to wait until after childbirth to get this treated with cautery. She will return at a future date.     Return if symptoms worsen or fail to improve, for Biopsy and Cauterization of PG.  I, Mosetta Anis, CMA, am acting as scribe for [employer], DO.   Documentation: I have reviewed the above documentation for accuracy and completeness, and I agree with the above.  Langston Reusing, DO

## 2022-10-30 DIAGNOSIS — Z3483 Encounter for supervision of other normal pregnancy, third trimester: Secondary | ICD-10-CM | POA: Diagnosis not present

## 2022-10-30 DIAGNOSIS — Z3685 Encounter for antenatal screening for Streptococcus B: Secondary | ICD-10-CM | POA: Diagnosis not present

## 2022-10-30 LAB — OB RESULTS CONSOLE GBS: GBS: NEGATIVE

## 2022-11-01 IMAGING — US US MFM OB DETAIL+14 WK
1 series · 12 of 28 positions shown · non-contrast
Comparison: none

[Series 1: us mfm ob detail+14 wk · 204 acquisitions, 12 frames shown]
[im 8/204]
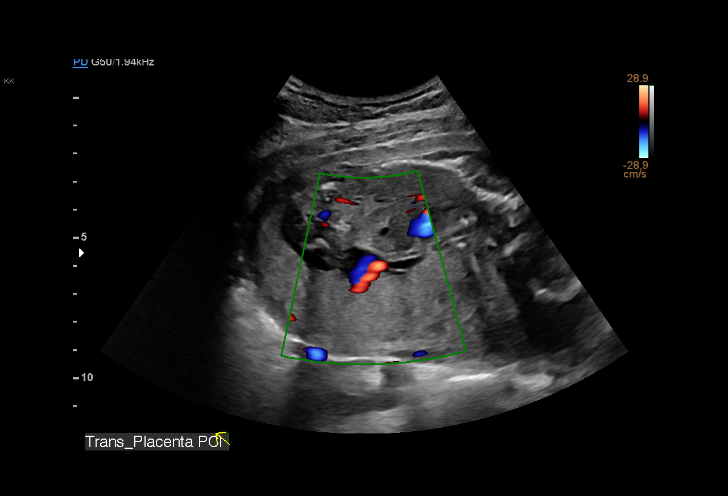
[im 23/204]
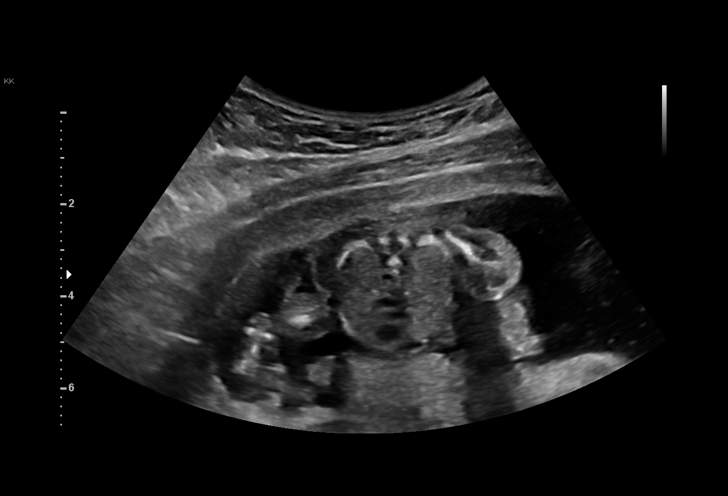
[im 38/204]
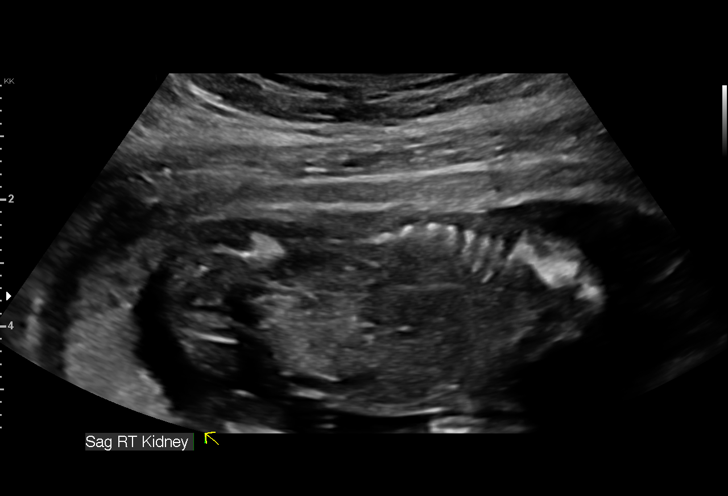
[im 61/204]
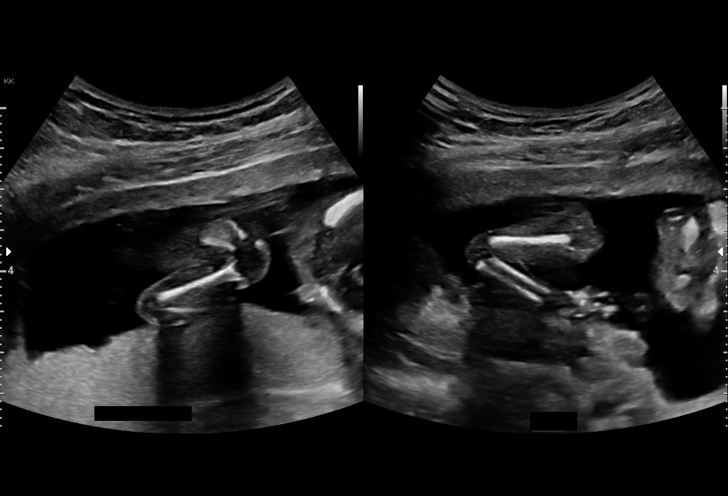
[im 76/204]
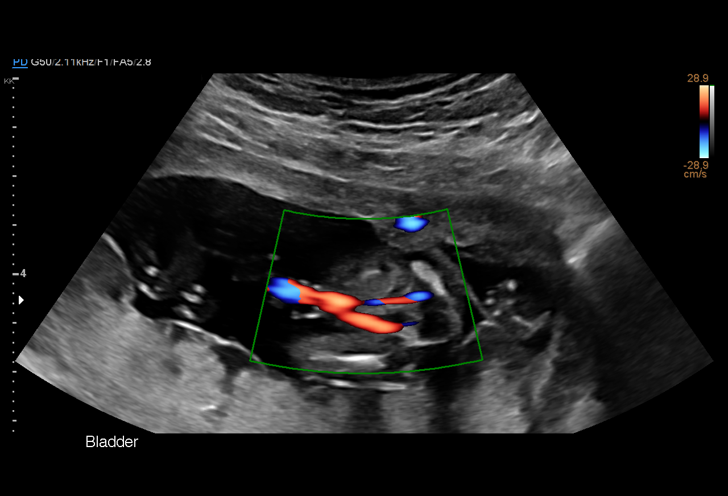
[im 91/204]
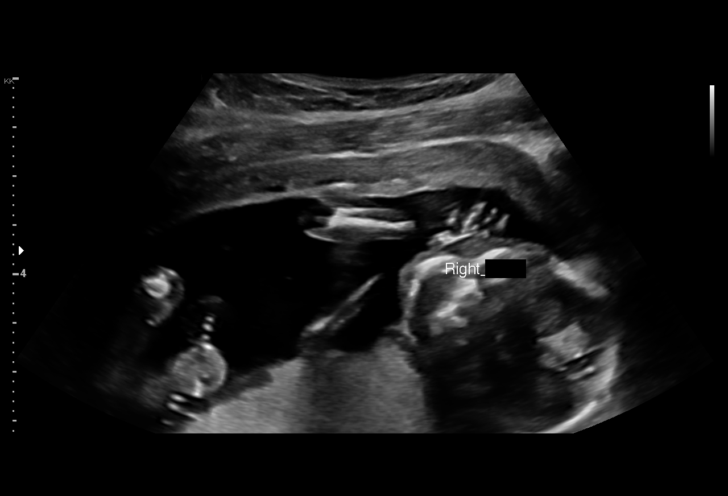
[im 113/204]
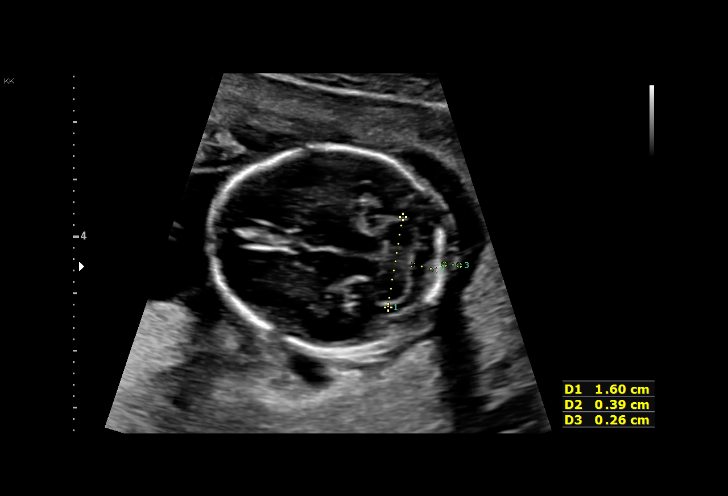
[im 128/204]
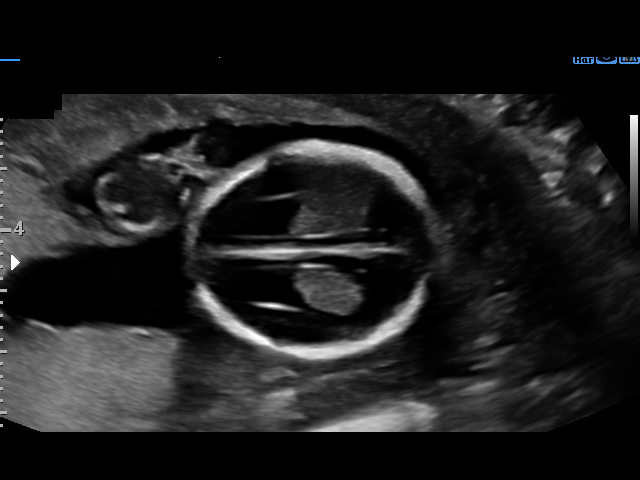
[im 143/204]
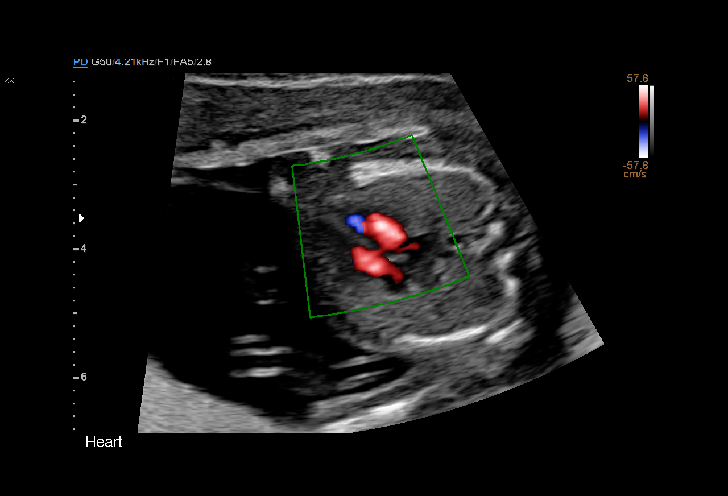
[im 166/204]
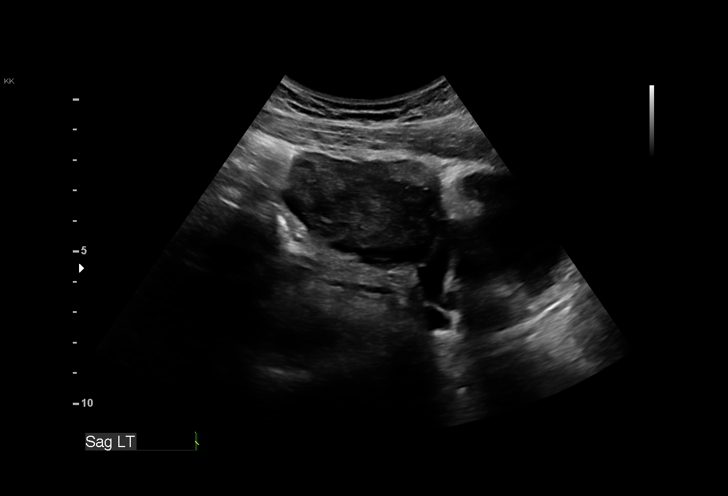
[im 181/204]
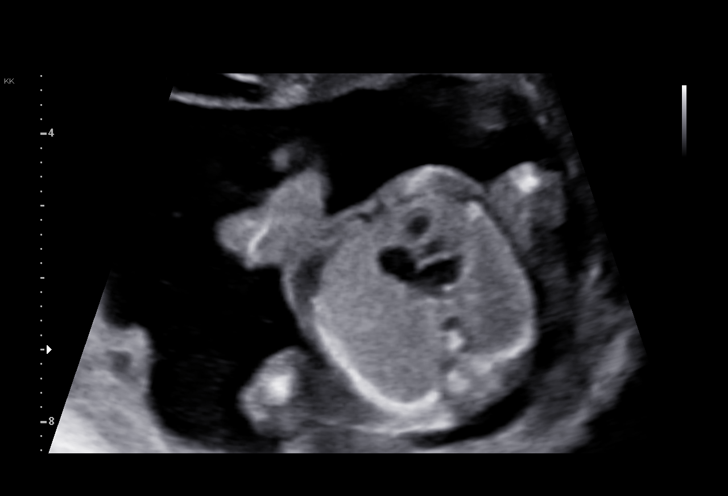
[im 196/204]
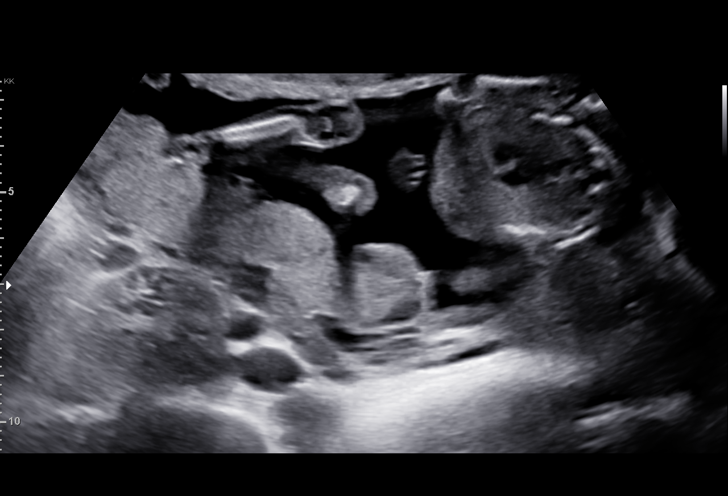

[12 of 28 positions shown; findings below may reference images not displayed]

OB/GYN &
                                                            Infertility
                                                            [id number] [HOSPITAL]

Indications

 Twin pregnancy with loss (demise) of one       O30.009,
 fetus, antepartum
 Echogenic intracardiac focus of the heart
 (EIF)
 16 weeks gestation of pregnancy
 Encounter for antenatal screening for
 malformations
Fetal Evaluation

 Num Of Fetuses:         1
 Fetal Heart Rate(bpm):  140
 Cardiac Activity:       Observed
 Presentation:           Cephalic
 Placenta:               Posterior
 P. Cord Insertion:      Visualized

 Amniotic Fluid
 AFI FV:      Within normal limits

                             Largest Pocket(cm)

Biometry
 BPD:      35.3  mm     G. Age:  16w 6d         69  %    CI:        77.86   %    70 - 86
                                                         FL/HC:      17.2   %    13.3 -
 HC:      126.6  mm     G. Age:  16w 3d         36  %    HC/AC:      1.04        1.05 -
 AC:      121.4  mm     G. Age:  17w 6d         90  %    FL/BPD:     61.8   %
 FL:       21.8  mm     G. Age:  16w 4d         49  %    FL/AC:      18.0   %    20 - 24
 HUM:      21.9  mm     G. Age:  16w 5d         61  %

 Est. FW:     181  gm      0 lb 6 oz     85  %
OB History

 Gravidity:    3         Term:   0        Prem:   0        SAB:   2
 TOP:          0       Ectopic:  0        Living: 0
Gestational Age

 LMP:           15w 5d        Date:  12/20/19                 EDD:   09/25/20
 U/S Today:     17w 0d                                        EDD:   09/16/20
 Best:          16w 3d     Det. By:  Early Ultrasound         EDD:   09/20/20
                                     (03/11/20)
Anatomy

 Cranium:               Appears normal         Aortic Arch:            Appears normal
 Choroid Plexus:        Appears normal         Ductal Arch:            Appears normal
 Cerebellum:            Appears normal         Diaphragm:              Appears normal
 Posterior Fossa:       Appears normal         Stomach:                Appears normal, left
                                                                       sided
 Nuchal Fold:           Appears normal         Abdomen:                Appears normal
 Face:                  Appears normal         Abdominal Wall:         Appears nml (cord
                        (orbits and profile)                           insert, abd wall)
 Lips:                  Appears normal         Cord Vessels:           Appears normal (3
                                                                       vessel cord)
 Palate:                Not well visualized    Kidneys:                Appear normal
 Thoracic:              Appears normal         Bladder:                Appears normal
 Heart:                 Appears normal; EIF    Spine:                  Appears normal
 RVOT:                  Not well visualized    Upper Extremities:      Appears normal
 LVOT:                  Appears normal         Lower Extremities:      Appears normal

 Other:  Fetus appears to be female. Right Heel visualized. Hands visualized.
         Technically difficult due to fetal position.
Cervix Uterus Adnexa

 Cervix
 Not visualized (advanced GA >09wks)
Comments

 This patient was seen for an ultrasound as this pregnancy
 initially started out as a dichorionic, diamniotic twin gestation.
 Unfortunately, at around 12 weeks, a demise of one twin was
 noted.  She reports that the demised twin was measuring one
 week smaller in size as compared to the other twin based on
 her early ultrasounds.  The patient reports two prior
 miscarriages.  She denies any other significant past medical
 history and denies any other problems in her current
 pregnancy.  This is a spontaneously conceived twin
 pregnancy.
 The patient reports that she had the [facility] cell free DNA
 test drawn earlier in her pregnancy that showed atypical
 results.  She reports that the cell free DNA test was drawn
 while both fetuses were still alive.  She reports that [facility]
 could not provide any further details other than the atypical
 result.
 The remnants of the demised fetus was noted today.  The
 crown-rump length of the demised fetus measured around 10
 weeks.  A thick dividing membrane was noted separating the
 two fetuses, indicating that this was a dichorionic, diamniotic
 twin gestation.  The patient was reassured that as this was a
 dichorionic twin gestation, the demise of one twin will not
 affect the other.  We will treat her pregnancy as a singleton
 gestation from now on.
 The fetal growth and amniotic fluid levels of the remaining live
 fetus appeared appropriate for her gestational age.
 An intracardiac echogenic focus was noted in the left
 ventricle of the fetal heart.  The small association between an
 echogenic focus and Down syndrome was discussed.

 Due to the atypical results noted on her cell free DNA test
 and the echogenic focus noted today, the patient was offered
 and declined an amniocentesis for definitive diagnosis of fetal
 aneuploidy.  She also declined to speak to our genetic
 counselor today.

 We will continue to follow her with serial ultrasound exams.

 A follow-up exam was scheduled in 4 weeks to complete the
 views of the fetal anatomy.  The patient was advised that she
 may still have an amniocentesis, genetic counseling, or have
 a repeat cell free DNA test drawn when she returns for her
 next ultrasound in our office.

## 2022-11-06 DIAGNOSIS — F4321 Adjustment disorder with depressed mood: Secondary | ICD-10-CM | POA: Diagnosis not present

## 2022-11-08 DIAGNOSIS — O26843 Uterine size-date discrepancy, third trimester: Secondary | ICD-10-CM | POA: Diagnosis not present

## 2022-11-08 DIAGNOSIS — F4322 Adjustment disorder with anxiety: Secondary | ICD-10-CM | POA: Diagnosis not present

## 2022-11-08 DIAGNOSIS — Z3A37 37 weeks gestation of pregnancy: Secondary | ICD-10-CM | POA: Diagnosis not present

## 2022-11-24 MED ORDER — MISOPROSTOL 200 MCG PO TABS
ORAL_TABLET | ORAL | Status: AC
  Filled 2022-11-24: qty 5

## 2022-11-28 ENCOUNTER — Encounter (HOSPITAL_COMMUNITY): Payer: Self-pay | Admitting: *Deleted

## 2022-11-28 ENCOUNTER — Telehealth (HOSPITAL_COMMUNITY): Payer: Self-pay | Admitting: *Deleted

## 2022-11-28 NOTE — Telephone Encounter (Signed)
Preadmission screen  

## 2022-11-30 ENCOUNTER — Other Ambulatory Visit: Payer: Self-pay | Admitting: Obstetrics

## 2022-12-04 ENCOUNTER — Inpatient Hospital Stay (HOSPITAL_COMMUNITY): Payer: Commercial Managed Care - PPO

## 2022-12-04 NOTE — H&P (Signed)
Late entry to note- see around 130p  Brooke Fernandez is a 37 y.o. [id number] at [redacted]w[redacted]d presenting for post-dates IOL. Pt notes occasional contractions. Good fetal movement, No vaginal bleeding, not leaking fluid.  [facility] at [facility] since 7 wks - Dated by LMP c/w 7 wk u/s - post-dates, nl testing, now agreeable to IOL - growth 37 wks: 8'6/ 97%, AC 99% - proven to 8'7 GERD, on protonix after failing pepcid - h/o anxiety/ depresssion, sx stable though preg. Not on meds and given h/o poor reponse to SSRI in past, plan for Trintellix PP if she needs   Prenatal Transfer Tool  Maternal Diabetes: No Genetic Screening: Normal Maternal Ultrasounds/Referrals: Normal Fetal Ultrasounds or other Referrals:  None Maternal Substance Abuse:  No Significant Maternal Medications:  None Significant Maternal Lab Results: Group B Strep negative     OB History     Gravida  4   Para  1   Term  1   Preterm      AB  2   Living  1      SAB  2   IAB      Ectopic      Multiple  0   Live Births  1          Past Medical History:  Diagnosis Date   Anorexia nervosa, restricting type    Anxiety    Bulimia    Depression    Elevated cholesterol    Past Surgical History:  Procedure Laterality Date   NO PAST SURGERIES     Family History: family history includes Anxiety disorder in her brother; Breast cancer in her maternal aunt; CAD in her maternal grandmother; Colon cancer in her maternal grandfather; Colon polyps in her mother; Depression in her brother and mother; Irritable bowel syndrome in her mother. Social History:  reports that she has never smoked. She has never used smokeless tobacco. She reports current alcohol use. She reports that she does not use drugs.  Review of Systems - Negative except discomfort of pregnancy    Physical Exam:  Vitals:   12/05/22 1450 12/05/22 1507  BP: 111/68 111/70  Pulse: 91 88  Resp: 16 16  Temp:    SpO2: 96%     Gen: well  appearing, no distress  Back: no CVAT Abd: gravid, NT, no RUQ pain LE: no edema, equal bilaterally, non-tender at variability  Prenatal labs: ABO, Rh: O/Positive/-- (11/03 0000) Antibody: Negative (11/03 0000) Rubella: Immune (12/15 0000) RPR: Nonreactive (12/15 0000)  HBsAg: Negative (12/15 0000)  HIV: Non-reactive (12/15 0000)  GBS: Negative/-- (06/11 0000)  1 hr Glucola 94  Genetic screening normal [facility] Anatomy US normal  CBC    Component Value Date/Time   WBC 10.8 (H) 12/05/2022 0554   RBC 3.96 12/05/2022 0554   HGB 11.7 (L) 12/05/2022 0554   HGB 12.8 01/07/2022 1229   HCT 35.7 (L) 12/05/2022 0554   HCT 39.1 01/07/2022 1229   PLT 288 12/05/2022 0554   PLT 288 01/07/2022 1229   MCV 90.2 12/05/2022 0554   MCV 95 01/07/2022 1229   MCH 29.5 12/05/2022 0554   MCHC 32.8 12/05/2022 0554   RDW 13.5 12/05/2022 0554   RDW 11.9 01/07/2022 1229   LYMPHSABS 3.1 08/27/2017 1648   EOSABS 0.0 08/27/2017 1648   BASOSABS 0.0 08/27/2017 1648    Assessment/Plan: 36 y.o. G4P1021 at [redacted]w[redacted]d - post dates IOL. Plan pit 2x2, AROM when able.  - LGA - h/o depression/ anxiety, plan  Trintellix if needed.    Lendon Colonel 12/04/2022, 10:35 PM  Lendon Colonel 12/05/2022 3:20 PM

## 2022-12-05 ENCOUNTER — Inpatient Hospital Stay (HOSPITAL_COMMUNITY)
Admission: AD | Admit: 2022-12-05 | Discharge: 2022-12-07 | DRG: 807 | Disposition: A | Discharge status: Home / Self Care | Payer: Commercial Managed Care - PPO | Attending: Obstetrics | Admitting: Obstetrics

## 2022-12-05 ENCOUNTER — Other Ambulatory Visit: Payer: Self-pay

## 2022-12-05 ENCOUNTER — Encounter (HOSPITAL_COMMUNITY): Payer: Self-pay | Admitting: Obstetrics

## 2022-12-05 ENCOUNTER — Inpatient Hospital Stay (HOSPITAL_COMMUNITY): Payer: Commercial Managed Care - PPO | Admitting: Anesthesiology

## 2022-12-05 ENCOUNTER — Inpatient Hospital Stay (HOSPITAL_COMMUNITY): Payer: Commercial Managed Care - PPO

## 2022-12-05 DIAGNOSIS — O9962 Diseases of the digestive system complicating childbirth: Secondary | ICD-10-CM | POA: Diagnosis not present

## 2022-12-05 DIAGNOSIS — O480 Post-term pregnancy: Secondary | ICD-10-CM | POA: Diagnosis not present

## 2022-12-05 DIAGNOSIS — O3663X0 Maternal care for excessive fetal growth, third trimester, not applicable or unspecified: Secondary | ICD-10-CM | POA: Diagnosis not present

## 2022-12-05 DIAGNOSIS — Z3490 Encounter for supervision of normal pregnancy, unspecified, unspecified trimester: Secondary | ICD-10-CM | POA: Diagnosis present

## 2022-12-05 DIAGNOSIS — K219 Gastro-esophageal reflux disease without esophagitis: Secondary | ICD-10-CM | POA: Diagnosis present

## 2022-12-05 DIAGNOSIS — Z3A41 41 weeks gestation of pregnancy: Secondary | ICD-10-CM | POA: Diagnosis not present

## 2022-12-05 LAB — TYPE AND SCREEN
ABO/RH(D): O POS
Antibody Screen: NEGATIVE

## 2022-12-05 LAB — CBC
HCT: 35.7 % — ABNORMAL LOW (ref 36.0–46.0)
Hemoglobin: 11.7 g/dL — ABNORMAL LOW (ref 12.0–15.0)
MCH: 29.5 pg (ref 26.0–34.0)
MCHC: 32.8 g/dL (ref 30.0–36.0)
MCV: 90.2 fL (ref 80.0–100.0)
Platelets: 288 10*3/uL (ref 150–400)
RBC: 3.96 MIL/uL (ref 3.87–5.11)
RDW: 13.5 % (ref 11.5–15.5)
WBC: 10.8 10*3/uL — ABNORMAL HIGH (ref 4.0–10.5)
nRBC: 0 % (ref 0.0–0.2)

## 2022-12-05 LAB — RPR: RPR Ser Ql: NONREACTIVE

## 2022-12-05 MED ORDER — ACETAMINOPHEN 325 MG PO TABS: 650.0000 mg | ORAL_TABLET | ORAL | Status: DC | PRN

## 2022-12-05 MED ORDER — EPHEDRINE 5 MG/ML INJ: 10.0000 mg | INTRAVENOUS | Status: DC | PRN

## 2022-12-05 MED ORDER — OXYTOCIN BOLUS FROM INFUSION
333.0000 mL | Freq: Once | INTRAVENOUS | Status: AC
  Administered 2022-12-05: 333 mL via INTRAVENOUS

## 2022-12-05 MED ORDER — FENTANYL-BUPIVACAINE-NACL 0.5-0.125-0.9 MG/250ML-% EP SOLN
12.0000 mL/h | EPIDURAL | Status: DC | PRN
  Administered 2022-12-05: 12 mL/h via EPIDURAL
  Filled 2022-12-05: qty 250

## 2022-12-05 MED ORDER — DIPHENHYDRAMINE HCL 50 MG/ML IJ SOLN: 12.5000 mg | INTRAMUSCULAR | Status: DC | PRN

## 2022-12-05 MED ORDER — PHENYLEPHRINE 80 MCG/ML (10ML) SYRINGE FOR IV PUSH (FOR BLOOD PRESSURE SUPPORT)
80.0000 ug | PREFILLED_SYRINGE | INTRAVENOUS | Status: DC | PRN
  Filled 2022-12-05: qty 10

## 2022-12-05 MED ORDER — SIMETHICONE 80 MG PO CHEW: 80.0000 mg | CHEWABLE_TABLET | ORAL | Status: DC | PRN

## 2022-12-05 MED ORDER — OXYTOCIN-SODIUM CHLORIDE 30-0.9 UT/500ML-% IV SOLN
1.0000 m[IU]/min | INTRAVENOUS | Status: DC
  Administered 2022-12-05: 2 m[IU]/min via INTRAVENOUS
  Filled 2022-12-05: qty 500

## 2022-12-05 MED ORDER — LACTATED RINGERS IV SOLN: 500.0000 mL | INTRAVENOUS | Status: DC | PRN

## 2022-12-05 MED ORDER — COCONUT OIL OIL: 1.0000 | TOPICAL_OIL | Status: DC | PRN

## 2022-12-05 MED ORDER — FAMOTIDINE IN NACL 20-0.9 MG/50ML-% IV SOLN: 20.0000 mg | Freq: Once | INTRAVENOUS | Status: DC

## 2022-12-05 MED ORDER — BENZOCAINE-MENTHOL 20-0.5 % EX AERO: 1.0000 | INHALATION_SPRAY | CUTANEOUS | Status: DC | PRN

## 2022-12-05 MED ORDER — SENNOSIDES-DOCUSATE SODIUM 8.6-50 MG PO TABS
2.0000 | ORAL_TABLET | ORAL | Status: DC
  Administered 2022-12-07: 2 via ORAL
  Filled 2022-12-05: qty 2

## 2022-12-05 MED ORDER — DIPHENHYDRAMINE HCL 25 MG PO CAPS: 25.0000 mg | ORAL_CAPSULE | Freq: Four times a day (QID) | ORAL | Status: DC | PRN

## 2022-12-05 MED ORDER — ZOLPIDEM TARTRATE 5 MG PO TABS: 5.0000 mg | ORAL_TABLET | Freq: Every evening | ORAL | Status: DC | PRN

## 2022-12-05 MED ORDER — LIDOCAINE HCL (PF) 1 % IJ SOLN
INTRAMUSCULAR | Status: DC | PRN
  Administered 2022-12-05 (×2): 4 mL via EPIDURAL

## 2022-12-05 MED ORDER — OXYTOCIN-SODIUM CHLORIDE 30-0.9 UT/500ML-% IV SOLN
2.5000 [IU]/h | INTRAVENOUS | Status: DC
  Administered 2022-12-05: 2.5 [IU]/h via INTRAVENOUS

## 2022-12-05 MED ORDER — TERBUTALINE SULFATE 1 MG/ML IJ SOLN: 0.2500 mg | Freq: Once | INTRAMUSCULAR | Status: DC | PRN

## 2022-12-05 MED ORDER — ONDANSETRON HCL 4 MG/2ML IJ SOLN
4.0000 mg | Freq: Four times a day (QID) | INTRAMUSCULAR | Status: DC | PRN
  Administered 2022-12-05: 4 mg via INTRAVENOUS
  Filled 2022-12-05: qty 2

## 2022-12-05 MED ORDER — LACTATED RINGERS IV SOLN: 500.0000 mL | Freq: Once | INTRAVENOUS | Status: DC

## 2022-12-05 MED ORDER — PRENATAL MULTIVITAMIN CH
1.0000 | ORAL_TABLET | Freq: Every day | ORAL | Status: DC
  Filled 2022-12-05 (×2): qty 1

## 2022-12-05 MED ORDER — ONDANSETRON HCL 4 MG/2ML IJ SOLN: 4.0000 mg | INTRAMUSCULAR | Status: DC | PRN

## 2022-12-05 MED ORDER — TETANUS-DIPHTH-ACELL PERTUSSIS 5-2.5-18.5 LF-MCG/0.5 IM SUSY: 0.5000 mL | PREFILLED_SYRINGE | Freq: Once | INTRAMUSCULAR | Status: DC

## 2022-12-05 MED ORDER — PHENYLEPHRINE 80 MCG/ML (10ML) SYRINGE FOR IV PUSH (FOR BLOOD PRESSURE SUPPORT)
80.0000 ug | PREFILLED_SYRINGE | INTRAVENOUS | Status: DC | PRN
  Administered 2022-12-05: 80 ug via INTRAVENOUS

## 2022-12-05 MED ORDER — OXYCODONE HCL 5 MG PO TABS: 10.0000 mg | ORAL_TABLET | ORAL | Status: DC | PRN

## 2022-12-05 MED ORDER — ONDANSETRON HCL 4 MG PO TABS: 4.0000 mg | ORAL_TABLET | ORAL | Status: DC | PRN

## 2022-12-05 MED ORDER — LIDOCAINE HCL (PF) 1 % IJ SOLN: 30.0000 mL | INTRAMUSCULAR | Status: DC | PRN

## 2022-12-05 MED ORDER — OXYCODONE HCL 5 MG PO TABS: 5.0000 mg | ORAL_TABLET | ORAL | Status: DC | PRN

## 2022-12-05 MED ORDER — LACTATED RINGERS IV SOLN: INTRAVENOUS | Status: DC

## 2022-12-05 MED ORDER — WITCH HAZEL-GLYCERIN EX PADS: 1.0000 | MEDICATED_PAD | CUTANEOUS | Status: DC | PRN

## 2022-12-05 MED ORDER — SOD CITRATE-CITRIC ACID 500-334 MG/5ML PO SOLN: 30.0000 mL | ORAL | Status: DC | PRN

## 2022-12-05 MED ORDER — IBUPROFEN 600 MG PO TABS
600.0000 mg | ORAL_TABLET | Freq: Four times a day (QID) | ORAL | Status: DC
  Administered 2022-12-05 – 2022-12-07 (×7): 600 mg via ORAL
  Filled 2022-12-05 (×7): qty 1

## 2022-12-05 MED ORDER — DIBUCAINE (PERIANAL) 1 % EX OINT: 1.0000 | TOPICAL_OINTMENT | CUTANEOUS | Status: DC | PRN

## 2022-12-05 NOTE — Progress Notes (Signed)
S: Doing well, no complaints, pain well  controlled with epidural  O: BP 106/71   Pulse 70   Temp 98.1 F (36.7 C) (Oral)   Resp 16   Ht 5\' 4"  (1.626 m)   Wt 75.4 kg   LMP 01/02/2022   SpO2 96%   BMI 28.55 kg/m    FHT:  FHR: 120 bpm, variability: moderate,  accelerations:  Present,  decelerations:  Absent UC:   regular, every 4 minutes, at 16 milliunits/min SVE:   Dilation: 4 Effacement (%): 40 Station: -3 Exam by:: Dr. Ernestina Penna AROM for slightly bloody fluid  A / P:  37 y.o.  OB History  Gravida Para Term Preterm AB Living  4 1 1  0 2 1  SAB IAB Ectopic Multiple Live Births  2 0 0 0 1   at [redacted]w[redacted]d Induction of labor due to postterm,  progressing well on pitocin, expect more rapid change after rupture of membranes.  Fetal Wellbeing:  Category I Pain Control:  Epidural  Anticipated MOD:  NSVD  Lendon Colonel 12/05/2022, 3:35 PM

## 2022-12-05 NOTE — Anesthesia Procedure Notes (Signed)
Epidural Patient location during procedure: OB Start time: 12/05/2022 2:28 PM End time: 12/05/2022 2:33 PM  Staffing Anesthesiologist: Linton Rump, MD Performed: anesthesiologist   Preanesthetic Checklist Completed: patient identified, IV checked, site marked, risks and benefits discussed, surgical consent, monitors and equipment checked, pre-op evaluation and timeout performed  Epidural Patient position: sitting Prep: DuraPrep and site prepped and draped Patient monitoring: continuous pulse ox and blood pressure Approach: midline Location: L3-L4 Injection technique: LOR saline  Needle:  Needle type: Tuohy  Needle gauge: 17 G Needle length: 9 cm and 9 Needle insertion depth: 4 cm Catheter type: closed end flexible Catheter size: 19 Gauge Catheter at skin depth: 8 cm Test dose: negative  Assessment Events: blood not aspirated, no cerebrospinal fluid, injection not painful, no injection resistance, no paresthesia and negative IV test  Additional Notes The patient has requested an epidural for labor pain management. Risks and benefits including, but not limited to, infection, bleeding, local anesthetic toxicity, headache, hypotension, back pain, block failure, etc. were discussed with the patient. The patient expressed understanding and consented to the procedure. I confirmed that the patient has no bleeding disorders and is not taking blood thinners. I confirmed the patient's last platelet count with the nurse. A time-out was performed immediately prior to the procedure. Please see nursing documentation for vital signs. Sterile technique was used throughout the whole procedure. Once LOR achieved, the epidural catheter threaded easily without resistance. Aspiration of the catheter was negative for blood and CSF. The epidural was dosed slowly and an infusion was started.  1 attempt(s)Reason for block:procedure for pain

## 2022-12-05 NOTE — Plan of Care (Signed)

## 2022-12-05 NOTE — Progress Notes (Signed)
Upon shift report from Nurse Alessandra Bevels at [phone], patient's oxytocin was set to 4 milli-units/min. This rate did not reflect in the patients chart. At 0752 I increased her oxytocin up by 2 milli-units/min per order to 6 milli-units/min.

## 2022-12-05 NOTE — Plan of Care (Signed)
Problem: Education: Goal: Knowledge of General Education information will improve Description: Including pain rating scale, medication(s)/side effects and non-pharmacologic comfort measures 12/05/2022 2028 by Joretta Bachelor, RN Outcome: Progressing 12/05/2022 1938 by Joretta Bachelor, RN Outcome: Progressing   Problem: Health Behavior/Discharge Planning: Goal: Ability to manage health-related needs will improve 12/05/2022 2028 by Joretta Bachelor, RN Outcome: Progressing 12/05/2022 1938 by Joretta Bachelor, RN Outcome: Progressing   Problem: Clinical Measurements: Goal: Ability to maintain clinical measurements within normal limits will improve 12/05/2022 2028 by Joretta Bachelor, RN Outcome: Progressing 12/05/2022 1938 by Joretta Bachelor, RN Outcome: Progressing Goal: Will remain free from infection 12/05/2022 2028 by Joretta Bachelor, RN Outcome: Progressing 12/05/2022 1938 by Joretta Bachelor, RN Outcome: Progressing Goal: Diagnostic test results will improve 12/05/2022 2028 by Joretta Bachelor, RN Outcome: Progressing 12/05/2022 1938 by Joretta Bachelor, RN Outcome: Progressing Goal: Respiratory complications will improve 12/05/2022 2028 by Joretta Bachelor, RN Outcome: Progressing 12/05/2022 1938 by Joretta Bachelor, RN Outcome: Progressing Goal: Cardiovascular complication will be avoided 12/05/2022 2028 by Joretta Bachelor, RN Outcome: Progressing 12/05/2022 1938 by Joretta Bachelor, RN Outcome: Progressing   Problem: Activity: Goal: Risk for activity intolerance will decrease 12/05/2022 2028 by Joretta Bachelor, RN Outcome: Progressing 12/05/2022 1938 by Joretta Bachelor, RN Outcome: Progressing   Problem: Nutrition: Goal: Adequate nutrition will be maintained 12/05/2022 2028 by Joretta Bachelor, RN Outcome: Progressing 12/05/2022 1938 by Joretta Bachelor, RN Outcome: Progressing   Problem: Coping: Goal: Level of anxiety will decrease 12/05/2022 2028 by Joretta Bachelor, RN Outcome: Progressing 12/05/2022 1938 by Joretta Bachelor, RN Outcome: Progressing   Problem: Elimination: Goal: Will not experience complications related to bowel motility 12/05/2022 2028 by Joretta Bachelor, RN Outcome: Progressing 12/05/2022 1938 by Joretta Bachelor, RN Outcome: Progressing Goal: Will not experience complications related to urinary retention 12/05/2022 2028 by Joretta Bachelor, RN Outcome: Progressing 12/05/2022 1938 by Joretta Bachelor, RN Outcome: Progressing   Problem: Pain Managment: Goal: General experience of comfort will improve 12/05/2022 2028 by Joretta Bachelor, RN Outcome: Progressing 12/05/2022 1938 by Joretta Bachelor, RN Outcome: Progressing   Problem: Safety: Goal: Ability to remain free from injury will improve 12/05/2022 2028 by Joretta Bachelor, RN Outcome: Progressing 12/05/2022 1938 by Joretta Bachelor, RN Outcome: Progressing   Problem: Skin Integrity: Goal: Risk for impaired skin integrity will decrease 12/05/2022 2028 by Joretta Bachelor, RN Outcome: Progressing 12/05/2022 1938 by Joretta Bachelor, RN Outcome: Progressing   Problem: Education: Goal: Knowledge of Childbirth will improve 12/05/2022 2028 by Joretta Bachelor, RN Outcome: Progressing 12/05/2022 1938 by Joretta Bachelor, RN Outcome: Progressing Goal: Ability to make informed decisions regarding treatment and plan of care will improve 12/05/2022 2028 by Joretta Bachelor, RN Outcome: Progressing 12/05/2022 1938 by Joretta Bachelor, RN Outcome: Progressing Goal: Ability to state and carry out methods to decrease the pain will improve 12/05/2022 2028 by Joretta Bachelor, RN Outcome: Progressing 12/05/2022 1938 by Joretta Bachelor, RN Outcome: Progressing Goal: Individualized Educational Video(s) 12/05/2022 2028 by Joretta Bachelor, RN Outcome: Progressing 12/05/2022 1938 by Joretta Bachelor, RN Outcome: Progressing   Problem: Coping: Goal: Ability to verbalize  concerns and feelings about labor and delivery will improve 12/05/2022 2028 by Joretta Bachelor, RN Outcome: Progressing 12/05/2022 1938 by Joretta Bachelor, RN Outcome: Progressing   Problem: Life Cycle: Goal: Ability to make normal progression through stages of  labor will improve 12/05/2022 2028 by Joretta Bachelor, RN Outcome: Progressing 12/05/2022 1938 by Joretta Bachelor, RN Outcome: Progressing Goal: Ability to effectively push during vaginal delivery will improve 12/05/2022 2028 by Joretta Bachelor, RN Outcome: Progressing 12/05/2022 1938 by Joretta Bachelor, RN Outcome: Progressing   Problem: Role Relationship: Goal: Will demonstrate positive interactions with the child 12/05/2022 2028 by Joretta Bachelor, RN Outcome: Progressing 12/05/2022 1938 by Joretta Bachelor, RN Outcome: Progressing   Problem: Safety: Goal: Risk of complications during labor and delivery will decrease 12/05/2022 2028 by Joretta Bachelor, RN Outcome: Progressing 12/05/2022 1938 by Joretta Bachelor, RN Outcome: Progressing   Problem: Pain Management: Goal: Relief or control of pain from uterine contractions will improve 12/05/2022 2028 by Joretta Bachelor, RN Outcome: Progressing 12/05/2022 1938 by Joretta Bachelor, RN Outcome: Progressing   Problem: Education: Goal: Knowledge of condition will improve Outcome: Progressing Goal: Individualized Educational Video(s) Outcome: Progressing Goal: Individualized Newborn Educational Video(s) Outcome: Progressing   Problem: Activity: Goal: Will verbalize the importance of balancing activity with adequate rest periods Outcome: Progressing Goal: Ability to tolerate increased activity will improve Outcome: Progressing   Problem: Coping: Goal: Ability to identify and utilize available resources and services will improve Outcome: Progressing   Problem: Life Cycle: Goal: Chance of risk for complications during the postpartum period will decrease Outcome:  Progressing   Problem: Role Relationship: Goal: Ability to demonstrate positive interaction with newborn will improve Outcome: Progressing   Problem: Skin Integrity: Goal: Demonstration of wound healing without infection will improve Outcome: Progressing

## 2022-12-05 NOTE — Anesthesia Preprocedure Evaluation (Signed)
Anesthesia Evaluation  Patient identified by MRN, date of birth, ID band Patient awake    Reviewed: Allergy & Precautions, NPO status , Patient's Chart, lab work & pertinent test results  History of Anesthesia Complications Negative for: history of anesthetic complications  Airway Mallampati: III       Dental   Pulmonary neg pulmonary ROS   Pulmonary exam normal breath sounds clear to auscultation       Cardiovascular (-) hypertension Rhythm:Regular Rate:Normal  HLD   Neuro/Psych  PSYCHIATRIC DISORDERS (h/o eating disorder) Anxiety Depression       GI/Hepatic Neg liver ROS,GERD  Medicated,,  Endo/Other  negative endocrine ROS    Renal/GU negative Renal ROS     Musculoskeletal   Abdominal   Peds  Hematology negative hematology ROS (+) Lab Results      Component                Value               Date                      WBC                      10.8 (H)            12/05/2022                HGB                      11.7 (L)            12/05/2022                HCT                      35.7 (L)            12/05/2022                MCV                      90.2                12/05/2022                PLT                      288                 12/05/2022              Anesthesia Other Findings PDIOL, LGA  Reproductive/Obstetrics (+) Pregnancy                              Anesthesia Physical Anesthesia Plan  ASA: 2  Anesthesia Plan: Epidural   Post-op Pain Management:    Induction:   PONV Risk Score and Plan:   Airway Management Planned:   Additional Equipment:   Intra-op Plan:   Post-operative Plan:   Informed Consent: I have reviewed the patients History and Physical, chart, labs and discussed the procedure including the risks, benefits and alternatives for the proposed anesthesia with the patient or authorized representative who has indicated his/her understanding and  acceptance.       Plan Discussed with: Anesthesiologist  Anesthesia Plan Comments: (I have discussed risks of neuraxial  anesthesia including but not limited to infection, bleeding, nerve injury, back pain, headache, seizures, and failure of block. Patient denies bleeding disorders and is not currently anticoagulated. Labs have been reviewed. Risks and benefits discussed. All patient's questions answered.  )         Anesthesia Quick Evaluation

## 2022-12-06 LAB — CBC
HCT: 31.7 % — ABNORMAL LOW (ref 36.0–46.0)
Hemoglobin: 10.5 g/dL — ABNORMAL LOW (ref 12.0–15.0)
MCH: 30.9 pg (ref 26.0–34.0)
MCHC: 33.1 g/dL (ref 30.0–36.0)
MCV: 93.2 fL (ref 80.0–100.0)
Platelets: 256 10*3/uL (ref 150–400)
RBC: 3.4 MIL/uL — ABNORMAL LOW (ref 3.87–5.11)
RDW: 13.6 % (ref 11.5–15.5)
WBC: 19.1 10*3/uL — ABNORMAL HIGH (ref 4.0–10.5)
nRBC: 0 % (ref 0.0–0.2)

## 2022-12-06 NOTE — Lactation Note (Signed)
This note was copied from a baby's chart. Lactation Consultation Note  Patient Name: Brooke Fernandez [id number] Date: 12/06/2022 Age:37 years Reason for consult: Initial assessment;Exclusive pumping and bottle feeding;Term Per mom has already pumped x 1 with her own pump with 10 ml.  LC reviewed engorgement prevention and tx   Maternal Data Does the patient have breastfeeding experience prior to this delivery?: Yes How long did the patient breastfeed?: per mom pumped and bottle fed for a 4 months  Feeding Mother's Current Feeding Choice: Breast Milk and Formula Nipple Type: Dr. Irving Burton level 1  LATCH Score Latch:  (only plans to pump)   Lactation Tools Discussed/Used Tools:  (mom has her own DEBP and prefers to use it , already started pumping)  Interventions Interventions: Education;LC Services brochure (mom only plans to pump and bottle feed)  Discharge Discharge Education: Engorgement and breast care;Warning signs for feeding baby Pump: DEBP;Personal (pe rmom Motiff)  Consult Status Consult Status: Complete Date: 12/06/22    Kathrin Greathouse 12/06/2022, 8:43 AM

## 2022-12-06 NOTE — Progress Notes (Signed)
       PPD # 1 S/P NSVD  Live born female  Birth Weight: 10 lb 5 oz (4678 g) APGAR: 7, 9  Newborn Delivery   Birth date/time: 12/05/2022 20:04:00 Delivery type: Vaginal, Spontaneous    Baby name: Graciella Belton Delivering provider: Noland Fordyce Episiotomy:None  Lacerations:None  circumcision planned  Feeding: breast  Pain control at delivery: Epidural  S:  Reports feeling well             Tolerating po/ No nausea or vomiting             Bleeding is moderate             Pain controlled with acetaminophen and ibuprofen (OTC)             Up ad lib / ambulatory / voiding without difficulties   O:  A & O x 3, in no apparent distress              VS:  Vitals:   12/05/22 2215 12/05/22 2330 12/06/22 0341 12/06/22 0752  BP: 113/75 108/69 (!) 101/56 109/81  Pulse: 89 (!) 101 76 63  Resp: 18 18 19 16   Temp: 98.5 F (36.9 C) 98.6 F (37 C) 98.1 F (36.7 C) 98 F (36.7 C)  TempSrc:  Oral Oral Oral  SpO2: 98% 100% 98% 98%  Weight:      Height:        LABS:  Recent Labs    12/05/22 0554 12/06/22 0633  WBC 10.8* 19.1*  HGB 11.7* 10.5*  HCT 35.7* 31.7*  PLT 288 256    Blood type: --/--/O POS (07/17 0554)  Rubella: Immune (12/15 0000)   I&O: I/O last 3 completed shifts: In: -  Out: 1500 [Urine:1350; Blood:150]          No intake/output data recorded.   Gen: AAO x 3, NAD  Abdomen: soft, non-tender, non-distended             Fundus: firm, non-tender, U-1  Perineum: intact  Lochia: small  Extremities: no edema, no calf pain or tenderness    A/P: PPD # 1 37 y.o., [id number]   Principal Problem:   Postpartum care following vaginal delivery 7/17 Active Problems:   Encounter for induction of labor   SVD (spontaneous vaginal delivery)   Doing well - stable status  Routine post partum orders  Anticipate discharge tomorrow    Neta Mends, MSN, CNM 12/06/2022, 8:49 AM

## 2022-12-06 NOTE — Progress Notes (Signed)
CSW received a consult for eating disorders with MOB. At this time this does not warrant CSW involvement and the disorder was not a factor during this pregnancy. Per chart review last remission was in 2020.  MOB was referred for history of depression/anxiety.  * Referral screened out by Clinical Social Worker because none of the following criteria appear to apply:  ~ History of anxiety/depression during this pregnancy, or of post-partum depression following prior delivery.  ~ Diagnosis of anxiety and/or depression within last 3 years  OR  * MOB's symptoms currently being treated with medication and/or therapy.  Per OB notes, MOB is participating in therapy and has a plan for the PP period to begin Trintellix for additional support if needed.   Please contact the Clinical Social Worker if needs arise, by [facility] request, or if MOB scores greater than 9/yes to question 10 on Edinburgh Postpartum Depression Screen.  Enos Fling, Theresia Majors Clinical Social Worker [phone]

## 2022-12-06 NOTE — Anesthesia Postprocedure Evaluation (Signed)
Anesthesia Post Note  Patient: Brooke Fernandez  Procedure(s) Performed: AN AD HOC LABOR EPIDURAL     Patient location during evaluation: Mother Baby Anesthesia Type: Epidural Level of consciousness: awake, oriented and awake and alert Pain management: pain level controlled Vital Signs Assessment: post-procedure vital signs reviewed and stable Respiratory status: spontaneous breathing, respiratory function stable and nonlabored ventilation Cardiovascular status: stable Postop Assessment: no headache, adequate PO intake, able to ambulate, patient able to bend at knees and no apparent nausea or vomiting Anesthetic complications: no   No notable events documented.  Last Vitals:  Vitals:   12/06/22 0341 12/06/22 0752  BP: (!) 101/56 109/81  Pulse: 76 63  Resp: 19 16  Temp: 36.7 C 36.7 C  SpO2: 98% 98%    Last Pain:  Vitals:   12/06/22 0752  TempSrc: Oral  PainSc: 3    Pain Goal: Patients Stated Pain Goal: 0 (12/05/22 1811)                 Tressie Ragin

## 2022-12-07 ENCOUNTER — Other Ambulatory Visit (HOSPITAL_COMMUNITY): Payer: Self-pay

## 2022-12-07 MED ORDER — BENZOCAINE-MENTHOL 20-0.5 % EX AERO: 1.0000 | INHALATION_SPRAY | CUTANEOUS | Status: DC | PRN

## 2022-12-07 MED ORDER — ACETAMINOPHEN 325 MG PO TABS
650.0000 mg | ORAL_TABLET | ORAL | 2 refills | Status: DC | PRN
  Filled 2022-12-07: qty 100, 9d supply, fill #0

## 2022-12-07 MED ORDER — IBUPROFEN 600 MG PO TABS
600.0000 mg | ORAL_TABLET | Freq: Four times a day (QID) | ORAL | 0 refills | Status: DC
  Filled 2022-12-07: qty 30, 8d supply, fill #0

## 2022-12-07 MED ORDER — COCONUT OIL OIL: 1.0000 | TOPICAL_OIL | Status: DC | PRN

## 2022-12-07 NOTE — Discharge Summary (Signed)
OB Discharge Summary  Patient Name: Brooke Fernandez DOB: August 08, 1985 MRN: [id number]  Date of admission: 12/05/2022 Delivering provider: Noland Fordyce  Admitting diagnosis: Encounter for induction of labor [Z34.90] Intrauterine pregnancy: [redacted]w[redacted]d     Secondary diagnosis: Patient Active Problem List   Diagnosis Date Noted   Encounter for induction of labor 09/22/2020   SVD (spontaneous vaginal delivery) 09/22/2020   Postpartum care following vaginal delivery 7/17 09/22/2020   Excessive daytime sleepiness 10/17/2017   Hypersomnia with long sleep time, idiopathic 10/17/2017   Moderate episode of recurrent major depressive disorder (HCC) 12/27/2015   Generalized anxiety disorder 12/27/2015   Chronic idiopathic constipation 05/21/1993   Additional problems:none   Date of discharge: 12/07/2022   Discharge diagnosis: Principal Problem:   Postpartum care following vaginal delivery 7/17 Active Problems:   Encounter for induction of labor   SVD (spontaneous vaginal delivery)                                                              Post partum procedures: none  Augmentation: AROM and Pitocin Pain control: Epidural Laceration:None Episiotomy:None Complications: None  Hospital course:  Induction of Labor With Vaginal Delivery   37 y.o. yo [id number] at [redacted]w[redacted]d was admitted to the hospital 12/05/2022 for induction of labor.  Indication for induction: Postdates and Elective.  Patient had an labor course complicated by none. Membrane Rupture Time/Date: 2:59 PM,12/05/2022  Delivery Method:Vaginal, Spontaneous Episiotomy: None Lacerations:  None Details of delivery can be found in separate delivery note.  Patient had a postpartum course complicated by none. Patient is discharged home 12/07/22.  Newborn Data: Birth date:12/05/2022 Birth time:8:04 PM Gender:Female Living status:Living Apgars:7 ,9  [id number] g  Physical exam  Vitals:   12/06/22 0752 12/06/22 1144 12/06/22 2039  12/07/22 0526  BP: 109/81 102/73 113/69 108/61  Pulse: 63 68 73 61  Resp: 16 16 19    Temp: 98 F (36.7 C) 98 F (36.7 C) 98 F (36.7 C) 98.2 F (36.8 C)  TempSrc: Oral Oral Oral Oral  SpO2: 98% 98% 99% 98%  Weight:      Height:       General: alert, cooperative, and no distress Lochia: appropriate Uterine Fundus: firm Incision: N/A Perineum: intact, no edema DVT Evaluation: No cords or calf tenderness. No significant calf/ankle edema. Labs: Lab Results  Component Value Date   WBC 19.1 (H) 12/06/2022   HGB 10.5 (L) 12/06/2022   HCT 31.7 (L) 12/06/2022   MCV 93.2 12/06/2022   PLT 256 12/06/2022      Latest Ref Rng & Units 01/07/2022   12:29 PM  CMP  Glucose 70 - 99 mg/dL 78   BUN 6 - 20 mg/dL 8   Creatinine 8.65 - 7.84 mg/dL 6.96   Sodium 295 - 284 mmol/L 141   Potassium 3.5 - 5.2 mmol/L 3.8   Chloride 96 - 106 mmol/L 101   CO2 20 - 29 mmol/L 22   Calcium 8.7 - 10.2 mg/dL 9.7   Total Protein 6.0 - 8.5 g/dL 7.5   Total Bilirubin 0.0 - 1.2 mg/dL <1.3   Alkaline Phos 44 - 121 IU/L 75   AST 0 - 40 IU/L 20   ALT 0 - 32 IU/L 19       12/06/2022  10:26 AM 09/23/2020    9:19 AM  Edinburgh Postnatal Depression Scale Screening Tool  I have been able to laugh and see the funny side of things. 0 0  I have looked forward with enjoyment to things. 0 0  I have blamed myself unnecessarily when things went wrong. 2 1  I have been anxious or worried for no good reason. 2 1  I have felt scared or panicky for no good reason. 1 0  Things have been getting on top of me. 1 1  I have been so unhappy that I have had difficulty sleeping. 0 0  I have felt sad or miserable. 0 0  I have been so unhappy that I have been crying. 1 0  The thought of harming myself has occurred to me. 0 0  Edinburgh Postnatal Depression Scale Total 7 3   Discharge instruction:  per After Visit Summary,  [facility] OB booklet and  "Understanding Mother & Baby Care" hospital booklet After Visit Meds:   Allergies as of 12/07/2022   No Known Allergies      Medication List     STOP taking these medications    Advanced Collagen Tabs   cephALEXin 500 MG capsule Commonly known as: KEFLEX   Prenatal Adult Gummy/DHA/FA 0.4-25 MG Chew       TAKE these medications    acetaminophen 325 MG tablet Commonly known as: Tylenol Take 2 tablets (650 mg total) by mouth every 4 (four) hours as needed. What changed: reasons to take this   benzocaine-Menthol 20-0.5 % Aero Commonly known as: DERMOPLAST Apply 1 Application topically as needed for irritation (perineal discomfort).   coconut oil Oil Apply 1 Application topically as needed.   ibuprofen 600 MG tablet Commonly known as: ADVIL Take 1 tablet (600 mg total) by mouth every 6 (six) hours.   pantoprazole 40 MG tablet Commonly known as: Protonix Take 1 tablet by mouth once daily               Discharge Care Instructions  (From admission, onward)           Start     Ordered   12/07/22 0000  Discharge wound care:       Comments: Sitz baths 2 times /day with warm water x 1 week. May add herbals: 1 ounce dried comfrey leaf* 1 ounce calendula flowers 1 ounce lavender flowers  Supplies can be found online at [employer] sources at [employer], [employer]  1/2 ounce dried uva ursi leaves 1/2 ounce witch hazel blossoms (if you can find them) 1/2 ounce dried sage leaf 1/2 cup sea salt Directions: Bring 2 quarts of water to a boil. Turn off heat, and place 1 ounce (approximately 1 large handful) of the above mixed herbs (not the salt) into the pot. Steep, covered, for 30 minutes.  Strain the liquid well with a fine mesh strainer, and discard the herb material. Add 2 quarts of liquid to the tub, along with the 1/2 cup of salt. This medicinal liquid can also be made into compresses and peri-rinses.   12/07/22 1102           Diet: routine diet Activity: Advance as tolerated. Pelvic rest for 6 weeks.   Postpartum contraception: TBA in office Newborn Data: Live born female  Birth Weight: 10 lb 5 oz (4678 g) APGAR: 7, 9  Newborn Delivery   Birth date/time: 12/05/2022 20:04:00 Delivery type: Vaginal, Spontaneous     named Graciella Belton  Baby Feeding: Bottle Disposition:home with mother Circumcision: completed / Dr. Ernestina Penna Delivery Report: Review the Delivery Report for details.   Follow up:  Follow-up Information     Noland Fordyce, MD. Schedule an appointment as soon as possible for a visit in 6 week(s).   Specialty: Obstetrics and Gynecology Why: For Postpartum follow-up Contact information: [address] [place] [address] [phone]                Signed: Neta Mends, CNM, MSN 12/07/2022, 11:03 AM

## 2022-12-07 NOTE — Discharge Instructions (Signed)
Lactation outpatient support - home visit   Jessica Bowers, IBCLC (lactation consultant)  & Birth Doula  Phone (text or call): [phone] Email: [email or web address]   Linda Coppola RN, MHA, IBCLC at [facility]: Lactation Consultant  [email or web address] Mail: [email or web address] Tel: [phone]   Additional breastfeeding resources:  [facility] https://ibconline.ca/information-sheets/  [employer] [place]  www.lllofnc.org   Other Resources:  Chiropractic specialist   Dr. Leanna Hastings [email or web address]   Craniosacral therapy for baby  Erin Balkind  [email or web address]  Pediatric Dentist (for tongue ties)  Dr. Kate Lambert Spangler, [facility]  Phone: [phone]  [address] [place]  [address] [email or web address]  

## 2022-12-11 ENCOUNTER — Ambulatory Visit: Payer: Commercial Managed Care - PPO | Admitting: Dermatology

## 2022-12-13 DIAGNOSIS — F411 Generalized anxiety disorder: Secondary | ICD-10-CM | POA: Diagnosis not present

## 2022-12-14 ENCOUNTER — Other Ambulatory Visit (HOSPITAL_COMMUNITY): Payer: Self-pay

## 2022-12-14 DIAGNOSIS — N939 Abnormal uterine and vaginal bleeding, unspecified: Secondary | ICD-10-CM | POA: Diagnosis not present

## 2022-12-14 MED ORDER — METHYLERGONOVINE MALEATE 0.2 MG PO TABS
ORAL_TABLET | ORAL | 0 refills | Status: DC
  Filled 2022-12-14: qty 6, 7d supply, fill #0

## 2022-12-17 ENCOUNTER — Other Ambulatory Visit (HOSPITAL_COMMUNITY): Payer: Self-pay

## 2022-12-18 DIAGNOSIS — F4322 Adjustment disorder with anxiety: Secondary | ICD-10-CM | POA: Diagnosis not present

## 2022-12-27 DIAGNOSIS — F411 Generalized anxiety disorder: Secondary | ICD-10-CM | POA: Diagnosis not present

## 2023-01-02 ENCOUNTER — Telehealth (HOSPITAL_COMMUNITY): Payer: Self-pay | Admitting: *Deleted

## 2023-01-02 NOTE — Telephone Encounter (Signed)
01/02/2023  Name: Brooke Fernandez MRN: [id number] DOB: [birth year]  Reason for Call:  Transition of Care Hospital Discharge Call  Contact Status: Patient Contact Status: Complete  Language assistant needed: Interpreter Mode: Interpreter Not Needed        Follow-Up Questions: Do You Have Any Concerns About Your Health As You Heal From Delivery?: No Do You Have Any Concerns About Your Infants Health?: No  Edinburgh Postnatal Depression Scale:  In the Past 7 Days: I have been able to laugh and see the funny side of things.: As much as I always could I have looked forward with enjoyment to things.: As much as I ever did I have blamed myself unnecessarily when things went wrong.: Not very often I have been anxious or worried for no good reason.: Yes, sometimes I have felt scared or panicky for no good reason.: Yes, sometimes Things have been getting on top of me.: No, most of the time I have coped quite well I have been so unhappy that I have had difficulty sleeping.: Not at all I have felt sad or miserable.: No, not at all I have been so unhappy that I have been crying.: Only occasionally The thought of harming myself has occurred to me.: Never Edinburgh Postnatal Depression Scale Total: 7  PHQ2-9 Depression Scale:     Discharge Follow-up:    Post-discharge interventions: Reviewed Newborn Safe Sleep Practices  Salena Saner, RN 01/02/2023 14:10

## 2023-01-08 ENCOUNTER — Ambulatory Visit: Payer: Commercial Managed Care - PPO | Admitting: Dermatology

## 2023-01-10 DIAGNOSIS — F411 Generalized anxiety disorder: Secondary | ICD-10-CM | POA: Diagnosis not present

## 2023-01-11 DIAGNOSIS — Z1331 Encounter for screening for depression: Secondary | ICD-10-CM | POA: Diagnosis not present

## 2023-01-17 DIAGNOSIS — F4322 Adjustment disorder with anxiety: Secondary | ICD-10-CM | POA: Diagnosis not present

## 2023-01-19 ENCOUNTER — Encounter: Payer: Self-pay | Admitting: Nurse Practitioner

## 2023-01-19 ENCOUNTER — Telehealth: Payer: Commercial Managed Care - PPO | Admitting: Nurse Practitioner

## 2023-01-19 ENCOUNTER — Other Ambulatory Visit (HOSPITAL_COMMUNITY): Payer: Self-pay

## 2023-01-19 DIAGNOSIS — N61 Mastitis without abscess: Secondary | ICD-10-CM | POA: Diagnosis not present

## 2023-01-19 MED ORDER — CEPHALEXIN 500 MG PO CAPS
500.0000 mg | ORAL_CAPSULE | Freq: Four times a day (QID) | ORAL | 0 refills | Status: AC
Start: 2023-01-19 — End: 2023-01-24
  Filled 2023-01-19: qty 20, 5d supply, fill #0

## 2023-01-19 NOTE — Patient Instructions (Signed)
  Darrold Span, thank you for joining Claiborne Rigg, NP for today's virtual visit.  While this provider is not your primary care provider (PCP), if your PCP is located in our provider database this encounter information will be shared with them immediately following your visit.   A [facility] MyChart account gives you access to today's visit and all your visits, tests, and labs performed at [facility] " click here if you don't have a [facility] MyChart account or go to [email or web address]  Consent: (Patient) Darrold Span provided verbal consent for this virtual visit at the beginning of the encounter.  Current Medications:  Current Outpatient Medications:    cephALEXin (KEFLEX) 500 MG capsule, Take 1 capsule (500 mg total) by mouth 4 (four) times daily for 5 days., Disp: 20 capsule, Rfl: 0   acetaminophen (TYLENOL) 325 MG tablet, Take 2 tablets (650 mg total) by mouth every 4 (four) hours as needed., Disp: 100 tablet, Rfl: 2   benzocaine-Menthol (DERMOPLAST) 20-0.5 % AERO, Apply 1 Application topically as needed for irritation (perineal discomfort)., Disp: , Rfl:    coconut oil OIL, Apply 1 Application topically as needed., Disp: , Rfl:    ibuprofen (ADVIL) 600 MG tablet, Take 1 tablet (600 mg total) by mouth every 6 (six) hours., Disp: 30 tablet, Rfl: 0   Medications ordered in this encounter:  Meds ordered this encounter  Medications   cephALEXin (KEFLEX) 500 MG capsule    Sig: Take 1 capsule (500 mg total) by mouth 4 (four) times daily for 5 days.    Dispense:  20 capsule    Refill:  0    Order Specific Question:   Supervising Provider    Answer:   Merrilee Jansky [id number]     *If you need refills on other medications prior to your next appointment, please contact your pharmacy*  Follow-Up: Call back or seek an in-person evaluation if the symptoms worsen or if the condition fails to improve as anticipated.  [facility] [phone]  Other  Instructions Apply warm compresses to the breast for swelling. Ice packs for comfort.    If you have been instructed to have an in-person evaluation today at a local Urgent Care facility, please use the link below. It will take you to a list of all of our available [facility], including address, phone number and hours of operation. Please do not delay care.  [facility]  If you or a family member do not have a primary care provider, use the link below to schedule a visit and establish care. When you choose a [facility] primary care physician or advanced practice provider, you gain a long-term partner in health. Find a Primary Care Provider  Learn more about [facility]'s in-office and virtual care options: [facility] - Get Care Now

## 2023-01-19 NOTE — Progress Notes (Signed)
Virtual Visit Consent   Brooke Fernandez, you are scheduled for a virtual visit with a [facility] provider today. Just as with appointments in the office, your consent must be obtained to participate. Your consent will be active for this visit and any virtual visit you may have with one of our providers in the next 365 days. If you have a MyChart account, a copy of this consent can be sent to you electronically.  As this is a virtual visit, video technology does not allow for your provider to perform a traditional examination. This may limit your provider's ability to fully assess your condition. If your provider identifies any concerns that need to be evaluated in person or the need to arrange testing (such as labs, EKG, etc.), we will make arrangements to do so. Although advances in technology are sophisticated, we cannot ensure that it will always work on either your end or our end. If the connection with a video visit is poor, the visit may have to be switched to a telephone visit. With either a video or telephone visit, we are not always able to ensure that we have a secure connection.  By engaging in this virtual visit, you consent to the provision of healthcare and authorize for your insurance to be billed (if applicable) for the services provided during this visit. Depending on your insurance coverage, you may receive a charge related to this service.  I need to obtain your verbal consent now. Are you willing to proceed with your visit today? Brooke Fernandez has provided verbal consent on 01/19/2023 for a virtual visit (video or telephone). Brooke Rigg, NP  Date: 01/19/2023 10:20 AM  Virtual Visit via Video Note   I, Brooke Fernandez, connected with  Brooke Fernandez  ([id number], 27-Mar-1986) on 01/19/23 at  9:30 AM EDT by a video-enabled telemedicine application and verified that I am speaking with the correct person using two identifiers.  Location: Patient: Virtual Visit Location Patient:  Home Provider: Virtual Visit Location Provider: Home Office   I discussed the limitations of evaluation and management by telemedicine and the availability of in person appointments. The patient expressed understanding and agreed to proceed.    History of Present Illness: Brooke Fernandez is a 37 y.o. who identifies as a female who was assigned female at birth, and is being seen today for mastitis.  Brooke Fernandez is 6 weeks post partum. She has been experiencing left breast erythema, pain and firmness despite breastfeeding/pumping. She denies any fever.    Problems:  Patient Active Problem List   Diagnosis Date Noted   Encounter for induction of labor 09/22/2020   SVD (spontaneous vaginal delivery) 09/22/2020   Postpartum care following vaginal delivery 7/17 09/22/2020   Excessive daytime sleepiness 10/17/2017   Hypersomnia with long sleep time, idiopathic 10/17/2017   Moderate episode of recurrent major depressive disorder (HCC) 12/27/2015   Generalized anxiety disorder 12/27/2015   Chronic idiopathic constipation 05/21/1993    Allergies: No Known Allergies Medications:  Current Outpatient Medications:    cephALEXin (KEFLEX) 500 MG capsule, Take 1 capsule (500 mg total) by mouth 4 (four) times daily for 5 days., Disp: 20 capsule, Rfl: 0   acetaminophen (TYLENOL) 325 MG tablet, Take 2 tablets (650 mg total) by mouth every 4 (four) hours as needed., Disp: 100 tablet, Rfl: 2   benzocaine-Menthol (DERMOPLAST) 20-0.5 % AERO, Apply 1 Application topically as needed for irritation (perineal discomfort)., Disp: , Rfl:    coconut oil OIL, Apply 1 Application  topically as needed., Disp: , Rfl:    ibuprofen (ADVIL) 600 MG tablet, Take 1 tablet (600 mg total) by mouth every 6 (six) hours., Disp: 30 tablet, Rfl: 0  Observations/Objective: Patient is well-developed, well-nourished in no acute distress.  Resting comfortably at home.  Head is normocephalic, atraumatic.  No labored breathing.  Speech is  clear and coherent with logical content.  Patient is alert and oriented at baseline.    Assessment and Plan: 1. Mastitis - cephALEXin (KEFLEX) 500 MG capsule; Take 1 capsule (500 mg total) by mouth 4 (four) times daily for 5 days.  Dispense: 20 capsule; Refill: 0  Apply warm compresses to the breast for swelling. Ice packs for comfort.   Follow Up Instructions: I discussed the assessment and treatment plan with the patient. The patient was provided an opportunity to ask questions and all were answered. The patient agreed with the plan and demonstrated an understanding of the instructions.  A copy of instructions were sent to the patient via MyChart unless otherwise noted below.     The patient was advised to call back or seek an in-person evaluation if the symptoms worsen or if the condition fails to improve as anticipated.  Time:  I spent 11 minutes with the patient via telehealth technology discussing the above problems/concerns.    Brooke Rigg, NP

## 2023-02-04 DIAGNOSIS — F4322 Adjustment disorder with anxiety: Secondary | ICD-10-CM | POA: Diagnosis not present

## 2023-02-04 DIAGNOSIS — F411 Generalized anxiety disorder: Secondary | ICD-10-CM | POA: Diagnosis not present

## 2023-02-06 DIAGNOSIS — F411 Generalized anxiety disorder: Secondary | ICD-10-CM | POA: Diagnosis not present

## 2023-02-11 DIAGNOSIS — F411 Generalized anxiety disorder: Secondary | ICD-10-CM | POA: Diagnosis not present

## 2023-02-18 DIAGNOSIS — F411 Generalized anxiety disorder: Secondary | ICD-10-CM | POA: Diagnosis not present

## 2023-02-28 DIAGNOSIS — F411 Generalized anxiety disorder: Secondary | ICD-10-CM | POA: Diagnosis not present

## 2023-03-13 DIAGNOSIS — F411 Generalized anxiety disorder: Secondary | ICD-10-CM | POA: Diagnosis not present

## 2023-04-05 DIAGNOSIS — F411 Generalized anxiety disorder: Secondary | ICD-10-CM | POA: Diagnosis not present

## 2023-04-12 DIAGNOSIS — F53 Postpartum depression: Secondary | ICD-10-CM | POA: Diagnosis not present

## 2023-04-13 ENCOUNTER — Other Ambulatory Visit (HOSPITAL_COMMUNITY): Payer: Self-pay

## 2023-04-13 MED ORDER — BUPROPION HCL ER (SR) 150 MG PO TB12
150.0000 mg | ORAL_TABLET | Freq: Two times a day (BID) | ORAL | 1 refills | Status: DC
  Filled 2023-04-13 (×4): qty 60, 30d supply, fill #0

## 2023-05-09 DIAGNOSIS — F411 Generalized anxiety disorder: Secondary | ICD-10-CM | POA: Diagnosis not present

## 2023-05-27 ENCOUNTER — Emergency Department (HOSPITAL_COMMUNITY)
Admission: EM | Admit: 2023-05-27 | Discharge: 2023-05-27 | Disposition: A | Discharge status: Home / Self Care | Payer: Commercial Managed Care - PPO | Attending: Emergency Medicine | Admitting: Emergency Medicine

## 2023-05-27 ENCOUNTER — Other Ambulatory Visit: Payer: Self-pay

## 2023-05-27 DIAGNOSIS — R7981 Abnormal blood-gas level: Secondary | ICD-10-CM | POA: Diagnosis not present

## 2023-05-27 DIAGNOSIS — R202 Paresthesia of skin: Secondary | ICD-10-CM | POA: Diagnosis not present

## 2023-05-27 DIAGNOSIS — R9431 Abnormal electrocardiogram [ECG] [EKG]: Secondary | ICD-10-CM | POA: Diagnosis not present

## 2023-05-27 DIAGNOSIS — R200 Anesthesia of skin: Secondary | ICD-10-CM | POA: Diagnosis not present

## 2023-05-27 LAB — CBC WITH DIFFERENTIAL/PLATELET
Abs Immature Granulocytes: 0.01 10*3/uL (ref 0.00–0.07)
Basophils Absolute: 0 10*3/uL (ref 0.0–0.1)
Basophils Relative: 0 %
Eosinophils Absolute: 0 10*3/uL (ref 0.0–0.5)
Eosinophils Relative: 0 %
HCT: 40.6 % (ref 36.0–46.0)
Hemoglobin: 13.1 g/dL (ref 12.0–15.0)
Immature Granulocytes: 0 %
Lymphocytes Relative: 34 %
Lymphs Abs: 3.2 10*3/uL (ref 0.7–4.0)
MCH: 31.5 pg (ref 26.0–34.0)
MCHC: 32.3 g/dL (ref 30.0–36.0)
MCV: 97.6 fL (ref 80.0–100.0)
Monocytes Absolute: 0.6 10*3/uL (ref 0.1–1.0)
Monocytes Relative: 6 %
Neutro Abs: 5.6 10*3/uL (ref 1.7–7.7)
Neutrophils Relative %: 60 %
Platelets: 358 10*3/uL (ref 150–400)
RBC: 4.16 MIL/uL (ref 3.87–5.11)
RDW: 12.7 % (ref 11.5–15.5)
WBC: 9.4 10*3/uL (ref 4.0–10.5)
nRBC: 0 % (ref 0.0–0.2)

## 2023-05-27 LAB — BASIC METABOLIC PANEL
Anion gap: 13 (ref 5–15)
BUN: 12 mg/dL (ref 6–20)
CO2: 21 mmol/L — ABNORMAL LOW (ref 22–32)
Calcium: 9.4 mg/dL (ref 8.9–10.3)
Chloride: 104 mmol/L (ref 98–111)
Creatinine, Ser: 0.48 mg/dL (ref 0.44–1.00)
GFR, Estimated: >60 mL/min (ref >60)
Glucose, Bld: 62 mg/dL — ABNORMAL LOW (ref 70–99)
Potassium: 3.9 mmol/L (ref 3.5–5.1)
Sodium: 138 mmol/L (ref 135–145)

## 2023-05-27 LAB — MAGNESIUM: Magnesium: 2.1 mg/dL (ref 1.7–2.4)

## 2023-05-27 LAB — PHOSPHORUS: Phosphorus: 3.7 mg/dL (ref 2.5–4.6)

## 2023-05-27 NOTE — ED Provider Notes (Signed)
 [place] EMERGENCY DEPARTMENT AT [facility] Provider Note   CSN: [id number] Arrival date & time: 05/27/23  1035     History  Chief Complaint  Patient presents with   Numbness    Brooke Fernandez is a 38 y.o. female presents today for bilateral tingling in hands and arms.  She states that has been intermittent for the last several weeks but this morning his began to be constant.  Patient denies fever, chills, chest pain, shortness of breath, nausea, vomiting, diarrhea, any other symptoms at this time.  Patient is currently 6 months postpartum and breast-feeding.  Of note patient states when she had her blood pressure taking while here, she described what to be a carpopedal spasm. HPI     Home Medications Prior to Admission medications   Medication Sig Start Date End Date Taking? Authorizing Provider  acetaminophen  (TYLENOL ) 325 MG tablet Take 2 tablets (650 mg total) by mouth every 4 (four) hours as needed. 12/07/22 12/07/23  Paul, Daniela C, CNM  benzocaine -Menthol  (DERMOPLAST) 20-0.5 % AERO Apply 1 Application topically as needed for irritation (perineal discomfort). 12/07/22   Paul, Daniela C, CNM  buPROPion  (WELLBUTRIN  SR) 150 MG 12 hr tablet Take 1 tablet (150 mg total) by mouth 2 (two) times daily. 04/12/23     coconut oil OIL Apply 1 Application topically as needed. 12/07/22   Paul, Daniela C, CNM  ibuprofen  (ADVIL ) 600 MG tablet Take 1 tablet (600 mg total) by mouth every 6 (six) hours. 12/07/22   Paul, Daniela C, CNM  pantoprazole  (PROTONIX ) 40 MG tablet Take 1 tablet by mouth once daily 09/18/22 12/14/22        Allergies    Patient has no known allergies.    Review of Systems   Review of Systems  Neurological:  Positive for numbness.    Physical Exam Updated Vital Signs BP 109/74 (BP Location: Right Arm)   Pulse 91   Temp 98.1 F (36.7 C) (Oral)   Resp 17   Ht 5' 4 (1.626 m)   Wt 59.9 kg   SpO2 99%   Breastfeeding Yes   BMI 22.66 kg/m  Physical  Exam Vitals and nursing note reviewed.  Constitutional:      General: She is not in acute distress.    Appearance: She is well-developed.  HENT:     Head: Normocephalic and atraumatic.     Nose: Nose normal.  Eyes:     Extraocular Movements: Extraocular movements intact.     Conjunctiva/sclera: Conjunctivae normal.  Cardiovascular:     Rate and Rhythm: Normal rate and regular rhythm.     Pulses: Normal pulses.     Heart sounds: Normal heart sounds. No murmur heard. Pulmonary:     Effort: Pulmonary effort is normal. No respiratory distress.     Breath sounds: Normal breath sounds.  Abdominal:     Palpations: Abdomen is soft.     Tenderness: There is no abdominal tenderness.  Musculoskeletal:        General: No swelling.     Cervical back: Neck supple.  Skin:    General: Skin is warm and dry.     Capillary Refill: Capillary refill takes less than 2 seconds.  Neurological:     General: No focal deficit present.     Mental Status: She is alert.     Cranial Nerves: No cranial nerve deficit.     Motor: No weakness.     Gait: Gait normal.     Comments:  Positive Trousseau's sign and positive Chvostek's sign  Psychiatric:        Mood and Affect: Mood normal.     ED Results / Procedures / Treatments   Labs (all labs ordered are listed, but only abnormal results are displayed) Labs Reviewed  BASIC METABOLIC PANEL - Abnormal; Notable for the following components:      Result Value   CO2 21 (*)    Glucose, Bld 62 (*)    All other components within normal limits  CBC WITH DIFFERENTIAL/PLATELET  MAGNESIUM  PHOSPHORUS    EKG EKG Interpretation Date/Time:  Monday May 27 2023 16:05:01 EST Ventricular Rate:  89 PR Interval:  136 QRS Duration:  76 QT Interval:  348 QTC Calculation: 423 R Axis:   40  Text Interpretation: Normal sinus rhythm Low voltage QRS Borderline ECG No previous ECGs available Confirmed by Ruthe Cornet [phone]) on 05/27/2023 5:08:28  PM  Radiology No results found.  Procedures Procedures    Medications Ordered in ED Medications - No data to display  ED Course/ Medical Decision Making/ A&P                                 Medical Decision Making Amount and/or Complexity of Data Reviewed Labs: ordered.   This patient presents to the ED with chief complaint(s) of numbness with pertinent past medical history of none which further complicates the presenting complaint. The complaint involves an extensive differential diagnosis and also carries with it a high risk of complications and morbidity.    The differential diagnosis includes carpal tunnel syndrome, cubital tunnel syndrome, electrolyte deficiency  Additional history obtained: Additional history obtained from family Records reviewed Primary Care Documents  ED Course and Reassessment:   Independent labs interpretation:  The following labs were independently interpreted:  CBC: No notable findings BMP: Mildly decreased CO2, calcium 9.4 Magnesium: 2.1 Phosphorus: 3.7 EKG: Normal sinus rhythm, Low voltage QRS, Borderline ECG   Consultation: - Consulted or discussed management/test interpretation w/ external professional: None  Consideration for admission or further workup: Considered for admission or further workup however patient's vital signs, physical exam, and labs have all been reassuring.  No emergent condition identified as cause for patient's symptoms.  Patient will be referred to neurology for further evaluation and treatment.  Patient will also follow-up with primary care physician for further evaluation and treatment.  Patient given strict return precautions.        Final Clinical Impression(s) / ED Diagnoses Final diagnoses:  Numbness    Rx / DC Orders ED Discharge Orders          Ordered    Ambulatory referral to Neurology       Comments: An appointment is requested in approximately: 1 week   05/27/23 1722               Francis Ileana SAILOR, PA-C 05/27/23 1722    Ruthe Cornet, DO 05/27/23 2346

## 2023-05-27 NOTE — Discharge Instructions (Addendum)
 Today you are seen for numbness.  Please follow-up with your primary care physician.  A referral for neurology has also been placed.  Please return to the ER if you have the worst headache of your life, numbness that extends further than the arms, new weakness, or neck pain.  Thank you for letting us  treat you today. After performing a physical exam and reviewing your labs, I feel you are safe to go home. Please follow up with your PCP in the next several days and provide them with your records from this visit. Return to the Emergency Room if pain becomes severe or symptoms worsen.

## 2023-05-27 NOTE — ED Triage Notes (Signed)
 Pt arrived via POV. C/o tingling in bilat hands. Pt states this has been happening for several weeks. It's worse when she holds her baby.  AOx4

## 2023-05-30 DIAGNOSIS — F411 Generalized anxiety disorder: Secondary | ICD-10-CM | POA: Diagnosis not present

## 2023-06-06 DIAGNOSIS — F411 Generalized anxiety disorder: Secondary | ICD-10-CM | POA: Diagnosis not present

## 2023-06-14 DIAGNOSIS — F411 Generalized anxiety disorder: Secondary | ICD-10-CM | POA: Diagnosis not present

## 2023-06-17 ENCOUNTER — Other Ambulatory Visit (HOSPITAL_COMMUNITY): Payer: Self-pay

## 2023-06-17 DIAGNOSIS — F411 Generalized anxiety disorder: Secondary | ICD-10-CM | POA: Diagnosis not present

## 2023-06-21 DIAGNOSIS — F411 Generalized anxiety disorder: Secondary | ICD-10-CM | POA: Diagnosis not present

## 2023-06-25 ENCOUNTER — Other Ambulatory Visit (HOSPITAL_COMMUNITY): Payer: Self-pay

## 2023-06-25 DIAGNOSIS — F53 Postpartum depression: Secondary | ICD-10-CM | POA: Diagnosis not present

## 2023-06-25 MED ORDER — TRINTELLIX 20 MG PO TABS
20.0000 mg | ORAL_TABLET | Freq: Every day | ORAL | 2 refills | Status: AC
  Filled 2023-06-25: qty 90, 90d supply, fill #0
  Filled 2023-11-19: qty 90, 90d supply, fill #1

## 2023-06-28 DIAGNOSIS — F411 Generalized anxiety disorder: Secondary | ICD-10-CM | POA: Diagnosis not present

## 2023-06-29 ENCOUNTER — Other Ambulatory Visit (HOSPITAL_COMMUNITY): Payer: Self-pay

## 2023-07-02 DIAGNOSIS — F411 Generalized anxiety disorder: Secondary | ICD-10-CM | POA: Diagnosis not present

## 2023-07-11 ENCOUNTER — Encounter: Payer: Self-pay | Admitting: Neurology

## 2023-07-11 ENCOUNTER — Ambulatory Visit: Payer: Commercial Managed Care - PPO | Admitting: Neurology

## 2023-07-11 VITALS — BP 123/67 | HR 70 | Ht 64.0 in | Wt 135.2 lb

## 2023-07-11 DIAGNOSIS — R202 Paresthesia of skin: Secondary | ICD-10-CM | POA: Diagnosis not present

## 2023-07-11 DIAGNOSIS — Z789 Other specified health status: Secondary | ICD-10-CM | POA: Diagnosis not present

## 2023-07-11 DIAGNOSIS — R208 Other disturbances of skin sensation: Secondary | ICD-10-CM

## 2023-07-11 DIAGNOSIS — R200 Anesthesia of skin: Secondary | ICD-10-CM

## 2023-07-11 MED ORDER — MULTI-VITAMIN/MINERALS PO TABS: ORAL_TABLET | ORAL | 2 refills | Status: AC

## 2023-07-11 NOTE — Patient Instructions (Addendum)
 We are looking to rule out a radiculopathy/ C5-6 Cervical Radiculopathy  Cervical radiculopathy happens when a nerve in the neck (a cervical nerve) is pinched or bruised. This condition can happen because of an injury to the cervical spine (vertebrae) in the neck, or as part of the normal aging process. Pressure on the cervical nerves can cause pain or numbness that travels from the neck all the way down to the arm and fingers. This condition usually gets better with rest. Treatment may be needed if the condition does not improve. What are the causes? This condition may be caused by: A neck injury. A bulging (herniated) disk. Muscle spasms. Muscle tightness in the neck due to overuse. Arthritis. Breakdown or degeneration in the bones and joints of the spine (spondylosis) due to aging. Bone spurs that may develop near the cervical nerves. What are the signs or symptoms? Symptoms of this condition include: Pain. The pain may travel from the neck to the arm and hand. The pain can be severe or irritating. It may get worse when you move your neck. Numbness or tingling in your arm or hand. Weakness in the affected arm and hand, in severe cases. ( That's when NCV and EMG are needed)  How is this diagnosed? This condition may be diagnosed based on your symptoms, your medical history, and a physical exam. You may also have tests, including: X-rays. CT scan. MRI. Electromyogram (EMG). Nerve conduction tests. How is this treated? In many cases, treatment is not needed for this condition. With rest, the condition usually gets better over time. If treatment is needed, options may include: Wearing a soft neck collar (cervical collar) for short periods of time. Doing physical therapy to strengthen your neck muscles. Taking medicines. These may include NSAIDs, such as ibuprofen, or oral corticosteroids. Having spinal injections, in severe cases. Having surgery. This may be needed if other treatments do  not help. Different types of surgery may be done depending on the cause of this condition. Follow these instructions at home: If you have a cervical collar: Wear it as told by your health care provider. Remove it only as told by your health care provider. Ask your health care provider if you can remove the cervical collar for cleaning and bathing. If you are allowed to remove the collar for cleaning or bathing: Follow instructions from your health care provider about how to remove the collar safely. Clean the collar by wiping it with mild soap and water and drying it completely. Take out any removable pads in the collar every 1-2 days, and wash them by hand with soap and water. Let them air-dry completely before you put them back in the collar. Check your skin under the collar for irritation or sores. If you see any, tell your health care provider. Managing pain     Take over-the-counter and prescription medicines only as told by your health care provider. If directed, put ice on the affected area. To do this: If you have a soft neck collar, remove it as told by your health care provider. Put ice in a plastic bag. Place a towel between your skin and the bag. Leave the ice on for 20 minutes, 2-3 times a day. Remove the ice if your skin turns bright red. This is very important. If you cannot feel pain, heat, or cold, you have a greater risk of damage to the area. If applying ice does not help, you can try using heat. Use the heat source that your  health care provider recommends, such as a moist heat pack or a heating pad. Place a towel between your skin and the heat source. Leave the heat on for 20-30 minutes. Remove the heat if your skin turns bright red. This is especially important if you are unable to feel pain, heat, or cold. You have a greater risk of getting burned. Try a gentle neck and shoulder massage to help relieve symptoms. Activity Rest as needed. Return to your normal  activities as told by your health care provider. Ask your health care provider what activities are safe for you. Do stretching and strengthening exercises as told by your health care provider or your physical therapist. You may have to avoid lifting. Ask your health care provider how much you can safely lift. General instructions Use a flat pillow when you sleep. Do not drive while wearing a cervical collar. If you do not have a cervical collar, ask your health care provider if it is safe to drive while your neck heals. Ask your health care provider if the medicine prescribed to you requires you to avoid driving or using machinery. Do not use any products that contain nicotine or tobacco. These products include cigarettes, chewing tobacco, and vaping devices, such as e-cigarettes. If you need help quitting, ask your health care provider. Keep all follow-up visits. This is important. Contact a health care provider if: Your condition does not improve with treatment. Get help right away if: Your pain gets much worse and is not controlled with medicines. You have weakness or numbness in your hand, arm, face, or leg. You have a high fever. You have a stiff, rigid neck. You lose control of your bowels or your bladder (have incontinence). You have trouble with walking, balance, or speaking. Summary Cervical radiculopathy happens when a nerve in the neck is pinched or bruised. A nerve can get pinched from a bulging disk, arthritis, muscle spasms, or an injury to the neck. Symptoms include pain, tingling, or numbness radiating from the neck to the arm or hand. Weakness can also occur in severe cases. Treatment may include rest, wearing a cervical collar, and physical therapy. Medicines may be prescribed to help with pain. In severe cases, injections or surgery may be needed. This information is not intended to replace advice given to you by your health care provider. Make sure you discuss any questions  you have with your health care provider. Document Revised: 11/10/2020 Document Reviewed: 11/10/2020 Elsevier Patient Education  2024 [employer]      38 y.o. year old breastfeeding woman  here with:     1)  breastfeeding young mother with  dyseasthesias, ED reported Positive Trousseau's sign and positive Chvostek's sign  Plan : magnesium and calcium supplementation OTC  ( over the counter) is recommended. PO.    2) I will order an MRI of the cervical spine.  The most often affected fingers were the ring and little finger of each hand. MRI to concerntrate on that , rule out impingement, central disc prolaps, demyelination.    3) No weakness reported - no NCV and EMG needed.    MRI ordered, multi-minerals recommended.    I plan to follow up  prn if these symptoms do not resolve or  if the MRI iis positive either personally or through our NP within  months.

## 2023-07-11 NOTE — Progress Notes (Addendum)
 SLEEP MEDICINE CLINIC    Provider:  Melvyn Novas, MD  Primary Care Physician:  Porfirio Oar, PA [address] [place] [address]     Referring Provider: Dolphus Jenny, Pa-c [address] [place],  [place] [address]          Chief Complaint according to patient   Patient presents with:     New Patient (Initial Visit), here for pins and needles, tingling,  not sleep-            HISTORY OF PRESENT ILLNESS:     I have the pleasure of seeing Shuntay Everetts on  07/11/23, she is a left -handed female   Safiatou Islam is a 38 y.o. female patient who is seen upon referral by ED at [facility] on 07/11/2023 .   [name] had presented there on. 05-27-2023: Anabelen Kaminsky is a 38 y.o. female presents today for bilateral tingling in hands and arms.  She states that has been intermittent for the last several weeks but this morning his began to be constant.  Patient denies fever, chills, chest pain, shortness of breath, nausea, vomiting, diarrhea, any other symptoms at this time.  Patient is currently 6 months postpartum and breast-feeding.  Of note patient states when she had her blood pressure taking while here, she described what to be a carpopedal spasm.    Chief concern according to patient :  Movement of hands and fingers is not affected, no spasms, she had paroxysmal  dysethesias, pina and needles in both hands, but stronger on her left hand- these have started after she gave birth to her second child,  a boy, in July 2024.  Spells were usually positional, when she held the baby in one position for  awhile, and would soon resolve after movements.  No tingling in feet and toes.     [place] of Systems: Out of a complete 14 system review, the patient complains of only the following symptoms, and all other reviewed systems are negative.:      Social History   Socioeconomic History   Marital status: Married    Spouse name: Not on file   Number of children: 2    Years of education: Not on file   Highest education level: Not on file  Occupational History   Occupation: Charity fundraiser    Employer: [employer]  Tobacco Use   Smoking status: Never   Smokeless tobacco: Never  Vaping Use   Vaping status: Never Used  Substance and Sexual Activity   Alcohol use: Yes    Alcohol/week: 0.0 standard drinks of alcohol    Comment: occasional    Drug use: No   Sexual activity: Yes    Partners: Male    Birth control/protection: None  Other Topics Concern   Not on file  Social History Narrative   Married to Bonnie Brae, [place], no children   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 [employer]   Never smoker 1 alcoholic drink on average per day 2 caffeinated beverages per day no other tobacco no drug use   Social Drivers of Corporate investment banker Strain: Low Risk  (12/11/2021)   Received from [facility], [facility]   Overall Financial Resource Strain (CARDIA)    Difficulty of Paying Living Expenses: Not hard at all  Food Insecurity: No Food Insecurity (12/05/2022)   Hunger Vital Sign    Worried About Running  Out of Food in the Last Year: Never true    Ran Out of Food in the Last Year: Never true  Transportation Needs: No Transportation Needs (12/05/2022)   PRAPARE - Administrator, Civil Service (Medical): No    Lack of Transportation (Non-Medical): No  Physical Activity: Sufficiently Active (12/11/2021)   Received from [facility], [facility]   Exercise Vital Sign    Days of Exercise per Week: 3 days    Minutes of Exercise per Session: 50 min  Stress: Stress Concern Present (12/11/2021)   Received from [facility], [facility] - Occupational Stress Questionnaire    Feeling of Stress : To some extent  Social Connections: Unknown (12/30/2022)   Received from [facility]   Social Network    Social Network: Not on file    Family History  Problem  Relation Age of Onset   Depression Mother    Colon polyps Mother    Anxiety disorder Brother    Depression Brother    Breast cancer Maternal Aunt    CAD Maternal Grandmother    Colon cancer Maternal Grandfather     Past Medical History:  Diagnosis Date   Anorexia nervosa, restricting type    Anxiety    Bulimia    Depression    Elevated cholesterol     Past Surgical History:  Procedure Laterality Date   NO PAST SURGERIES       Current Outpatient Medications on File Prior to Visit  Medication Sig Dispense Refill   vortioxetine HBr (TRINTELLIX) 20 MG TABS tablet Take 1 tablet (20 mg total) by mouth daily. 90 tablet 2   acetaminophen (TYLENOL) 325 MG tablet Take 2 tablets (650 mg total) by mouth every 4 (four) hours as needed. 100 tablet 2   benzocaine-Menthol (DERMOPLAST) 20-0.5 % AERO Apply 1 Application topically as needed for irritation (perineal discomfort).     buPROPion (WELLBUTRIN SR) 150 MG 12 hr tablet Take 1 tablet (150 mg total) by mouth 2 (two) times daily. 60 tablet 1   coconut oil OIL Apply 1 Application topically as needed.     ibuprofen (ADVIL) 600 MG tablet Take 1 tablet (600 mg total) by mouth every 6 (six) hours. 30 tablet 0   [DISCONTINUED] pantoprazole (PROTONIX) 40 MG tablet Take 1 tablet by mouth once daily 90 tablet 1   No current facility-administered medications on file prior to visit.    No Known Allergies   DIAGNOSTIC DATA (LABS, IMAGING, TESTING) - I reviewed patient records, labs, notes, testing and imaging myself where available.  Lab Results  Component Value Date   WBC 9.4 05/27/2023   HGB 13.1 05/27/2023   HCT 40.6 05/27/2023   MCV 97.6 05/27/2023   PLT 358 05/27/2023      Component Value Date/Time   NA 138 05/27/2023 1610   NA 141 01/07/2022 1229   K 3.9 05/27/2023 1610   CL 104 05/27/2023 1610   CO2 21 (L) 05/27/2023 1610   GLUCOSE 62 (L) 05/27/2023 1610   BUN 12 05/27/2023 1610   BUN 8 01/07/2022 1229   CREATININE 0.48  05/27/2023 1610   CREATININE 0.67 11/14/2015 1604   CALCIUM 9.4 05/27/2023 1610   PROT 7.5 01/07/2022 1229   ALBUMIN 5.0 (H) 01/07/2022 1229   AST 20 01/07/2022 1229   ALT 19 01/07/2022 1229   ALKPHOS 75 01/07/2022 1229   BILITOT <0.2 01/07/2022 1229   GFRNONAA >60 05/27/2023 1610  GFRAA 142 08/27/2017 1648   Lab Results  Component Value Date   CHOL 158 11/14/2015   HDL 74 11/14/2015   LDLCALC 65 11/14/2015   TRIG 96 11/14/2015   CHOLHDL 2.1 11/14/2015   No results found for: "HGBA1C" No results found for: "VITAMINB12" Lab Results  Component Value Date   TSH 1.060 08/27/2017    PHYSICAL EXAM:  Today's Vitals   07/11/23 1408  BP: 123/67  Pulse: 70  Weight: 135 lb 3.2 oz (61.3 kg)  Height: 5\' 4"  (1.626 m)   Body mass index is 23.21 kg/m.   Wt Readings from Last 3 Encounters:  07/11/23 135 lb 3.2 oz (61.3 kg)  05/27/23 132 lb (59.9 kg)  12/05/22 166 lb 4.8 oz (75.4 kg)     Ht Readings from Last 3 Encounters:  07/11/23 5\' 4"  (1.626 m)  05/27/23 5\' 4"  (1.626 m)  12/05/22 5\' 4"  (1.626 m)      General: The patient is awake, alert and appears not in acute distress. The patient is well groomed. Head: Normocephalic, atraumatic. Neck is supple.  Dental status: biologic. Cardiovascular:  Regular rate and cardiac rhythm by pulse,  without distended neck veins. Respiratory: Lungs are clear to auscultation.  Skin:  Without evidence of ankle edema, or rash. Trunk: The patient's posture is erect.   NEUROLOGIC EXAM: The patient is awake and alert, oriented to place and time.   Memory subjective described as intact.  Attention span & concentration ability appears normal.  Speech is fluent,  without  dysarthria, dysphonia or aphasia.  Mood and affect are appropriate.   Cranial nerves: no loss of smell or taste reported  Pupils are round and briskly reactive to light.  The left is larger than the right. Both constrict.  Extraocular movements in vertical and horizontal  planes were intact and without nystagmus. No Diplopia. Visual fields by finger perimetry are intact.  Facial sensation intact to fine touch. Facial motor strength is symmetric and tongue and uvula move midline.  Neck ROM : rotation, tilt and flexion extension were normal for age and shoulder shrug was symmetrical.    Motor exam:  Symmetric bulk, tone and ROM.   Normal tone without cog wheeling, symmetric grip strength .   Sensory:  Fine touch and vibration were normal.  Proprioception tested in the upper extremities was normal.   Coordination: Rapid alternating movements in the fingers/hands were of normal speed.  The Finger-to-nose maneuver was intact without evidence of ataxia, dysmetria or tremor.   Gait and station: Patient could rise unassisted from a seated position, walked without assistive device.  Stance is of normal width/ base and the patient turned with 3 steps. ( Observed ) Toe and heel walk were deferred.  Deep tendon reflexes: in the  upper and lower extremities are symmetric and intact.  Babinski response was deferred .  ASSESSMENT AND PLAN 38 y.o. year old breastfeeding woman  here with:    1)  breastfeeding young mother with  dyseasthesias, ED reported Positive Trousseau's sign and positive Chvostek's sign - -   Plan : magnesium and calcium supplement OTC is recommended. PO.   2) I will order an MRI of the cervical spine.  The most often affected fingers were the ring and little finger of each hand. MRI to concerntrate on that , rule out impingement, central disc prolaps, demyelination.   3) No weakness reported - no NCV and EMG needed.   MRI ordered, multi-minerals recommended.   I plan to  follow up  prn if these symptoms do not resolve or  if the MRI iis positive either personally or through our NP within  months.   I would like to thank Porfirio Oar, PA and Dolphus Jenny, Pa-c [address] [place],  [place] [address] for allowing me to meet with and to  take care of this pleasant patient.   CC: I will share my notes with PCP.  After spending a total time of  35  minutes face to face and additional time for physical and neurologic examination, review of laboratory studies,  personal review of imaging studies, reports and results of other testing and review of referral information / records as far as provided in visit,   Electronically signed by: Melvyn Novas, MD 07/11/2023 2:40 PM  [facility] and [facility] Sleep Board certified by The [employer] of Sleep Medicine and Diplomate of the [employer] of Sleep Medicine. Board certified In Neurology through the ABPN, Fellow of the [employer] of Neurology.

## 2023-07-15 DIAGNOSIS — F411 Generalized anxiety disorder: Secondary | ICD-10-CM | POA: Diagnosis not present

## 2023-07-23 ENCOUNTER — Ambulatory Visit: Payer: Commercial Managed Care - PPO

## 2023-07-23 DIAGNOSIS — Z789 Other specified health status: Secondary | ICD-10-CM | POA: Diagnosis not present

## 2023-07-23 DIAGNOSIS — R202 Paresthesia of skin: Secondary | ICD-10-CM

## 2023-07-23 DIAGNOSIS — R200 Anesthesia of skin: Secondary | ICD-10-CM

## 2023-07-23 DIAGNOSIS — R208 Other disturbances of skin sensation: Secondary | ICD-10-CM | POA: Diagnosis not present

## 2023-07-23 MED ORDER — GADOBENATE DIMEGLUMINE 529 MG/ML IV SOLN
12.0000 mL | Freq: Once | INTRAVENOUS | Status: AC | PRN
  Administered 2023-07-23: 12 mL via INTRAVENOUS

## 2023-07-24 ENCOUNTER — Ambulatory Visit: Payer: Commercial Managed Care - PPO | Admitting: Neurology

## 2023-07-25 ENCOUNTER — Encounter: Payer: Self-pay | Admitting: Neurology

## 2023-09-02 DIAGNOSIS — F411 Generalized anxiety disorder: Secondary | ICD-10-CM | POA: Diagnosis not present

## 2023-09-16 DIAGNOSIS — F411 Generalized anxiety disorder: Secondary | ICD-10-CM | POA: Diagnosis not present

## 2023-09-17 ENCOUNTER — Ambulatory Visit: Payer: Commercial Managed Care - PPO | Admitting: Neurology

## 2023-09-19 ENCOUNTER — Ambulatory Visit: Payer: Commercial Managed Care - PPO | Admitting: Neurology

## 2023-09-25 DIAGNOSIS — F411 Generalized anxiety disorder: Secondary | ICD-10-CM | POA: Diagnosis not present

## 2023-09-30 DIAGNOSIS — F411 Generalized anxiety disorder: Secondary | ICD-10-CM | POA: Diagnosis not present

## 2023-10-09 DIAGNOSIS — F411 Generalized anxiety disorder: Secondary | ICD-10-CM | POA: Diagnosis not present

## 2023-10-21 DIAGNOSIS — F411 Generalized anxiety disorder: Secondary | ICD-10-CM | POA: Diagnosis not present

## 2023-11-01 DIAGNOSIS — F411 Generalized anxiety disorder: Secondary | ICD-10-CM | POA: Diagnosis not present

## 2023-11-18 ENCOUNTER — Other Ambulatory Visit (HOSPITAL_COMMUNITY): Payer: Self-pay

## 2023-11-18 ENCOUNTER — Other Ambulatory Visit (HOSPITAL_BASED_OUTPATIENT_CLINIC_OR_DEPARTMENT_OTHER): Payer: Self-pay

## 2023-11-18 ENCOUNTER — Other Ambulatory Visit: Payer: Self-pay

## 2023-11-18 DIAGNOSIS — F411 Generalized anxiety disorder: Secondary | ICD-10-CM | POA: Diagnosis not present

## 2023-11-18 MED ORDER — DEXAMETHASONE 0.5 MG/5ML PO ELIX
ORAL_SOLUTION | ORAL | 2 refills | Status: AC
  Filled 2023-11-18: qty 500, 20d supply, fill #0

## 2023-11-19 ENCOUNTER — Other Ambulatory Visit (HOSPITAL_COMMUNITY): Payer: Self-pay

## 2023-11-25 ENCOUNTER — Telehealth: Admitting: Physician Assistant

## 2023-11-25 ENCOUNTER — Other Ambulatory Visit (HOSPITAL_COMMUNITY): Payer: Self-pay

## 2023-11-25 DIAGNOSIS — R3989 Other symptoms and signs involving the genitourinary system: Secondary | ICD-10-CM

## 2023-11-25 MED ORDER — CEPHALEXIN 500 MG PO CAPS
500.0000 mg | ORAL_CAPSULE | Freq: Two times a day (BID) | ORAL | 0 refills | Status: AC
  Filled 2023-11-25: qty 14, 7d supply, fill #0

## 2023-11-25 NOTE — Progress Notes (Signed)
 Virtual Visit Consent   Brooke Fernandez, you are scheduled for a virtual visit with a [facility] provider today. Just as with appointments in the office, your consent must be obtained to participate. Your consent will be active for this visit and any virtual visit you may have with one of our providers in the next 365 days. If you have a MyChart account, a copy of this consent can be sent to you electronically.  As this is a virtual visit, video technology does not allow for your provider to perform a traditional examination. This may limit your provider's ability to fully assess your condition. If your provider identifies any concerns that need to be evaluated in person or the need to arrange testing (such as labs, EKG, etc.), we will make arrangements to do so. Although advances in technology are sophisticated, we cannot ensure that it will always work on either your end or our end. If the connection with a video visit is poor, the visit may have to be switched to a telephone visit. With either a video or telephone visit, we are not always able to ensure that we have a secure connection.  By engaging in this virtual visit, you consent to the provision of healthcare and authorize for your insurance to be billed (if applicable) for the services provided during this visit. Depending on your insurance coverage, you may receive a charge related to this service.  I need to obtain your verbal consent now. Are you willing to proceed with your visit today? Brooke Fernandez has provided verbal consent on 36/11/2023 for a virtual visit (video or telephone). Teena Shuck, [place]  Date: 11/25/2023 4:21 PM   Virtual Visit via Video Note   I, Teena Shuck, connected with  Fredrick Dray  ([id number], [birth year]) on 11/25/23 at  4:30 PM EDT by a video-enabled telemedicine application and verified that I am speaking with the correct person using two identifiers.  Location: Patient: Virtual Visit Location Patient:  Home Provider: Virtual Visit Location Provider: Home Office   I discussed the limitations of evaluation and management by telemedicine and the availability of in person appointments. The patient expressed understanding and agreed to proceed.    History of Present Illness: Brooke Fernandez is a 38 y.o. who identifies as a female who was assigned female at birth, and is being seen today for UTI.  HPI: Urinary Tract Infection  This is a new problem. The current episode started yesterday. The problem occurs every urination. Associated symptoms include frequency and urgency. Her past medical history is significant for recurrent UTIs.    Problems:  Patient Active Problem List   Diagnosis Date Noted   Dysesthesia affecting both sides of body 07/11/2023   Numbness and tingling in both hands 07/11/2023   Breastfeeding (infant) 07/11/2023   Encounter for induction of labor 09/22/2020   SVD (spontaneous vaginal delivery) 09/22/2020   Postpartum care following vaginal delivery 7/17 09/22/2020   Excessive daytime sleepiness 10/17/2017   Hypersomnia with long sleep time, idiopathic 10/17/2017   Moderate episode of recurrent major depressive disorder (HCC) 12/27/2015   Generalized anxiety disorder 12/27/2015   Chronic idiopathic constipation 05/21/1993    Allergies: No Known Allergies Medications:  Current Outpatient Medications:    dexamethasone  0.5 MG/5ML elixir, RINSE WITH FOR ONE MINUTE THEN SPIT OUT, 4 - 5 TIMES PER DAY., Disp: 500 mL, Rfl: 2   Multiple Vitamins-Minerals (MULTIVITAMIN WITH MINERALS) tablet, Bid po with meals,, Disp: 60 tablet, Rfl: 2   vortioxetine  HBr (TRINTELLIX )  20 MG TABS tablet, Take 1 tablet (20 mg total) by mouth daily., Disp: 90 tablet, Rfl: 2  Observations/Objective: Patient is well-developed, well-nourished in no acute distress.  Resting comfortably  at home.  Head is normocephalic, atraumatic.  No labored breathing.  Speech is clear and coherent with logical  content.  Patient is alert and oriented at baseline.    Assessment and Plan: 1. Suspected UTI (Primary)  Patient presenting with dysuria most consistent with UTI. I also considered PID, pregnancy, ectopic pregnancy, yeast vaginitis, endometriosis, tubovarian abscess, appendicitis,  BV,  and pyelonephritis, but this appears less likely considering the data gathered thus far.  Medication prescribed. I have instructed the patient to present to the nearest ER at any time if there are any new or worsening symptoms.  The patient expressed understanding of and agreement with this plan.  Opportunity was given for questions prior to discharge and all stated questions were answered to the patient's satisfaction.   Follow Up Instructions: I discussed the assessment and treatment plan with the patient. The patient was provided an opportunity to ask questions and all were answered. The patient agreed with the plan and demonstrated an understanding of the instructions.  A copy of instructions were sent to the patient via MyChart unless otherwise noted below.    The patient was advised to call back or seek an in-person evaluation if the symptoms worsen or if the condition fails to improve as anticipated.    Teena Shuck, PA-C

## 2023-11-25 NOTE — Patient Instructions (Signed)
  Brooke Fernandez, thank you for joining Teena Shuck, PA-C for today's virtual visit.  While this provider is not your primary care provider (PCP), if your PCP is located in our provider database this encounter information will be shared with them immediately following your visit.   A [facility] MyChart account gives you access to today's visit and all your visits, tests, and labs performed at [facility]  click here if you don't have a [facility] MyChart account or go to [email or web address]  Consent: (Patient) Brooke Fernandez provided verbal consent for this virtual visit at the beginning of the encounter.  Current Medications:  Current Outpatient Medications:    dexamethasone  0.5 MG/5ML elixir, RINSE WITH 5ML FOR ONE MINUTE THEN SPIT OUT, 4 - 5 TIMES PER DAY., Disp: 500 mL, Rfl: 2   Multiple Vitamins-Minerals (MULTIVITAMIN WITH MINERALS) tablet, Bid po with meals,, Disp: 60 tablet, Rfl: 2   vortioxetine  HBr (TRINTELLIX ) 20 MG TABS tablet, Take 1 tablet (20 mg total) by mouth daily., Disp: 90 tablet, Rfl: 2   Medications ordered in this encounter:  No orders of the defined types were placed in this encounter.    *If you need refills on other medications prior to your next appointment, please contact your pharmacy*  Follow-Up: Call back or seek an in-person evaluation if the symptoms worsen or if the condition fails to improve as anticipated.  [facility] [phone]  Other Instructions Please report to the nearest Emergency room with any worsening symptoms. Follow up with primary care provider (PCP) in 2 -3 days.    If you have been instructed to have an in-person evaluation today at a local Urgent Care facility, please use the link below. It will take you to a list of all of our available [facility], including address, phone number and hours of operation. Please do not delay care.  [facility]  If you or a family member do  not have a primary care provider, use the link below to schedule a visit and establish care. When you choose a [facility] primary care physician or advanced practice provider, you gain a long-term partner in health. Find a Primary Care Provider  Learn more about [facility]'s in-office and virtual care options: [facility] - Get Care Now

## 2023-11-27 DIAGNOSIS — F411 Generalized anxiety disorder: Secondary | ICD-10-CM | POA: Diagnosis not present

## 2023-12-06 DIAGNOSIS — F411 Generalized anxiety disorder: Secondary | ICD-10-CM | POA: Diagnosis not present

## 2023-12-12 ENCOUNTER — Other Ambulatory Visit: Payer: Self-pay | Admitting: Medical Genetics

## 2023-12-16 DIAGNOSIS — F411 Generalized anxiety disorder: Secondary | ICD-10-CM | POA: Diagnosis not present

## 2023-12-18 ENCOUNTER — Other Ambulatory Visit

## 2023-12-18 DIAGNOSIS — Z006 Encounter for examination for normal comparison and control in clinical research program: Secondary | ICD-10-CM

## 2023-12-19 ENCOUNTER — Other Ambulatory Visit (HOSPITAL_COMMUNITY): Payer: Self-pay

## 2023-12-19 DIAGNOSIS — Z124 Encounter for screening for malignant neoplasm of cervix: Secondary | ICD-10-CM | POA: Diagnosis not present

## 2023-12-19 DIAGNOSIS — Z01411 Encounter for gynecological examination (general) (routine) with abnormal findings: Secondary | ICD-10-CM | POA: Diagnosis not present

## 2023-12-19 DIAGNOSIS — Z01419 Encounter for gynecological examination (general) (routine) without abnormal findings: Secondary | ICD-10-CM | POA: Diagnosis not present

## 2023-12-19 DIAGNOSIS — Z113 Encounter for screening for infections with a predominantly sexual mode of transmission: Secondary | ICD-10-CM | POA: Diagnosis not present

## 2023-12-19 DIAGNOSIS — Z0142 Encounter for cervical smear to confirm findings of recent normal smear following initial abnormal smear: Secondary | ICD-10-CM | POA: Diagnosis not present

## 2023-12-19 DIAGNOSIS — R319 Hematuria, unspecified: Secondary | ICD-10-CM | POA: Diagnosis not present

## 2023-12-19 DIAGNOSIS — Z1331 Encounter for screening for depression: Secondary | ICD-10-CM | POA: Diagnosis not present

## 2023-12-19 MED ORDER — TRINTELLIX 20 MG PO TABS
20.0000 mg | ORAL_TABLET | Freq: Every day | ORAL | 3 refills | Status: DC
  Filled 2023-12-19: qty 90, 90d supply, fill #0

## 2023-12-28 LAB — GENECONNECT MOLECULAR SCREEN: Genetic Analysis Overall Interpretation: NEGATIVE

## 2024-01-06 DIAGNOSIS — F411 Generalized anxiety disorder: Secondary | ICD-10-CM | POA: Diagnosis not present

## 2024-01-13 DIAGNOSIS — F411 Generalized anxiety disorder: Secondary | ICD-10-CM | POA: Diagnosis not present

## 2024-01-15 DIAGNOSIS — Z3200 Encounter for pregnancy test, result unknown: Secondary | ICD-10-CM | POA: Diagnosis not present

## 2024-01-15 DIAGNOSIS — O09299 Supervision of pregnancy with other poor reproductive or obstetric history, unspecified trimester: Secondary | ICD-10-CM | POA: Diagnosis not present

## 2024-01-17 DIAGNOSIS — O09299 Supervision of pregnancy with other poor reproductive or obstetric history, unspecified trimester: Secondary | ICD-10-CM | POA: Diagnosis not present

## 2024-01-31 DIAGNOSIS — F411 Generalized anxiety disorder: Secondary | ICD-10-CM | POA: Diagnosis not present

## 2024-02-12 DIAGNOSIS — F411 Generalized anxiety disorder: Secondary | ICD-10-CM | POA: Diagnosis not present

## 2024-02-13 DIAGNOSIS — Z3201 Encounter for pregnancy test, result positive: Secondary | ICD-10-CM | POA: Diagnosis not present

## 2024-02-24 DIAGNOSIS — F411 Generalized anxiety disorder: Secondary | ICD-10-CM | POA: Diagnosis not present

## 2024-02-25 ENCOUNTER — Other Ambulatory Visit (HOSPITAL_COMMUNITY): Payer: Self-pay

## 2024-02-25 ENCOUNTER — Other Ambulatory Visit: Payer: Self-pay

## 2024-02-25 DIAGNOSIS — Z3689 Encounter for other specified antenatal screening: Secondary | ICD-10-CM | POA: Diagnosis not present

## 2024-02-25 DIAGNOSIS — O09521 Supervision of elderly multigravida, first trimester: Secondary | ICD-10-CM | POA: Diagnosis not present

## 2024-02-25 DIAGNOSIS — Z3A10 10 weeks gestation of pregnancy: Secondary | ICD-10-CM | POA: Diagnosis not present

## 2024-02-25 DIAGNOSIS — Z118 Encounter for screening for other infectious and parasitic diseases: Secondary | ICD-10-CM | POA: Diagnosis not present

## 2024-02-25 DIAGNOSIS — Z1331 Encounter for screening for depression: Secondary | ICD-10-CM | POA: Diagnosis not present

## 2024-02-25 MED ORDER — PANTOPRAZOLE SODIUM 40 MG PO TBEC
40.0000 mg | DELAYED_RELEASE_TABLET | Freq: Every day | ORAL | 3 refills | Status: AC
  Filled 2024-02-25: qty 90, 90d supply, fill #0

## 2024-03-04 DIAGNOSIS — F411 Generalized anxiety disorder: Secondary | ICD-10-CM | POA: Diagnosis not present

## 2024-03-11 DIAGNOSIS — F411 Generalized anxiety disorder: Secondary | ICD-10-CM | POA: Diagnosis not present

## 2024-03-17 DIAGNOSIS — F411 Generalized anxiety disorder: Secondary | ICD-10-CM | POA: Diagnosis not present

## 2024-03-19 DIAGNOSIS — F411 Generalized anxiety disorder: Secondary | ICD-10-CM | POA: Diagnosis not present

## 2024-03-23 DIAGNOSIS — F411 Generalized anxiety disorder: Secondary | ICD-10-CM | POA: Diagnosis not present

## 2024-03-24 ENCOUNTER — Other Ambulatory Visit (HOSPITAL_COMMUNITY): Payer: Self-pay

## 2024-03-24 DIAGNOSIS — F418 Other specified anxiety disorders: Secondary | ICD-10-CM | POA: Diagnosis not present

## 2024-03-24 DIAGNOSIS — Z3A14 14 weeks gestation of pregnancy: Secondary | ICD-10-CM | POA: Diagnosis not present

## 2024-03-24 DIAGNOSIS — O09522 Supervision of elderly multigravida, second trimester: Secondary | ICD-10-CM | POA: Diagnosis not present

## 2024-03-24 DIAGNOSIS — Z23 Encounter for immunization: Secondary | ICD-10-CM | POA: Diagnosis not present

## 2024-03-24 MED ORDER — SERTRALINE HCL 50 MG PO TABS
50.0000 mg | ORAL_TABLET | Freq: Every day | ORAL | 3 refills | Status: AC
  Filled 2024-03-24: qty 90, 90d supply, fill #0
  Filled 2024-06-19: qty 90, 90d supply, fill #1

## 2024-03-30 DIAGNOSIS — Z3482 Encounter for supervision of other normal pregnancy, second trimester: Secondary | ICD-10-CM | POA: Diagnosis not present

## 2024-03-30 DIAGNOSIS — Z3483 Encounter for supervision of other normal pregnancy, third trimester: Secondary | ICD-10-CM | POA: Diagnosis not present

## 2024-04-08 DIAGNOSIS — F411 Generalized anxiety disorder: Secondary | ICD-10-CM | POA: Diagnosis not present

## 2024-04-28 DIAGNOSIS — O09522 Supervision of elderly multigravida, second trimester: Secondary | ICD-10-CM | POA: Diagnosis not present

## 2024-04-28 DIAGNOSIS — Z3A19 19 weeks gestation of pregnancy: Secondary | ICD-10-CM | POA: Diagnosis not present

## 2024-04-28 DIAGNOSIS — O09292 Supervision of pregnancy with other poor reproductive or obstetric history, second trimester: Secondary | ICD-10-CM | POA: Diagnosis not present

## 2024-04-28 DIAGNOSIS — Z361 Encounter for antenatal screening for raised alphafetoprotein level: Secondary | ICD-10-CM | POA: Diagnosis not present

## 2024-06-03 ENCOUNTER — Other Ambulatory Visit (HOSPITAL_COMMUNITY): Payer: Self-pay

## 2024-06-03 MED ORDER — SERTRALINE HCL 25 MG PO TABS
25.0000 mg | ORAL_TABLET | Freq: Every day | ORAL | 3 refills | Status: AC
  Filled 2024-06-03: qty 30, 30d supply, fill #0

## 2024-06-03 MED ORDER — SERTRALINE HCL 50 MG PO TABS
50.0000 mg | ORAL_TABLET | Freq: Every day | ORAL | 3 refills | Status: AC
  Filled 2024-06-03: qty 30, 30d supply, fill #0

## 2024-06-03 MED ORDER — SERTRALINE HCL 25 MG PO TABS: 25.0000 mg | ORAL_TABLET | Freq: Every day | ORAL | 3 refills | Status: AC

## 2024-06-04 ENCOUNTER — Other Ambulatory Visit (HOSPITAL_COMMUNITY): Payer: Self-pay

## 2024-06-19 ENCOUNTER — Other Ambulatory Visit: Payer: Self-pay

## 2024-06-19 ENCOUNTER — Other Ambulatory Visit (HOSPITAL_COMMUNITY): Payer: Self-pay
